# Patient Record
Sex: Male | Born: 1940 | Race: Black or African American | Hispanic: No | Marital: Married | Smoking: Never smoker
Health system: Southern US, Community
[De-identification: ages and names within clinical notes are randomized; demographics above are authoritative.]

## PROBLEM LIST (undated history)

## (undated) DIAGNOSIS — C189 Malignant neoplasm of colon, unspecified: Secondary | ICD-10-CM

## (undated) DIAGNOSIS — E119 Type 2 diabetes mellitus without complications: Secondary | ICD-10-CM

## (undated) DIAGNOSIS — I10 Essential (primary) hypertension: Secondary | ICD-10-CM

## (undated) DIAGNOSIS — E785 Hyperlipidemia, unspecified: Secondary | ICD-10-CM

## (undated) HISTORY — DX: Type 2 diabetes mellitus without complications: E11.9

## (undated) HISTORY — PX: COLON SURGERY: SHX602

## (undated) HISTORY — DX: Essential (primary) hypertension: I10

---

## 2005-01-26 DIAGNOSIS — G459 Transient cerebral ischemic attack, unspecified: Secondary | ICD-10-CM

## 2005-01-26 HISTORY — DX: Transient cerebral ischemic attack, unspecified: G45.9

## 2005-06-29 ENCOUNTER — Inpatient Hospital Stay (HOSPITAL_COMMUNITY): Admission: EM | Admit: 2005-06-29 | Discharge: 2005-07-01 | Payer: Self-pay | Admitting: Emergency Medicine

## 2005-06-29 ENCOUNTER — Ambulatory Visit (HOSPITAL_COMMUNITY): Admission: RE | Admit: 2005-06-29 | Discharge: 2005-06-29 | Payer: Self-pay | Admitting: Emergency Medicine

## 2005-06-30 ENCOUNTER — Ambulatory Visit: Payer: Self-pay | Admitting: Physical Medicine & Rehabilitation

## 2005-07-07 ENCOUNTER — Encounter: Admission: RE | Admit: 2005-07-07 | Discharge: 2005-10-05 | Payer: Self-pay | Admitting: *Deleted

## 2005-07-13 ENCOUNTER — Encounter: Admission: RE | Admit: 2005-07-13 | Discharge: 2005-10-11 | Payer: Self-pay | Admitting: *Deleted

## 2005-09-03 ENCOUNTER — Encounter: Payer: Self-pay | Admitting: Neurology

## 2005-09-16 ENCOUNTER — Ambulatory Visit (HOSPITAL_COMMUNITY): Admission: RE | Admit: 2005-09-16 | Discharge: 2005-09-16 | Payer: Self-pay | Admitting: Interventional Radiology

## 2007-03-20 IMAGING — XA IR ANGIO/CAROTID/CERV BI
1 series · 16 of 24 positions shown · IV contrast (omnipaque)
Comparison: none

CLINICAL DATA: Patient with left posterior fossa ischemic stroke secondary to left vertebrobasilar junction dissection with possible right vertebrobasilar junction stenosis.  
FOUR VESSEL CEREBRAL ARTERIOGRAM:
Following a full explanation of the procedure along with the potentially associated complications, an informed witnessed consent was obtained. 
The right groin was prepped and draped in the usual sterile fashion.  Thereafter using a modified Seldinger technique transfemoral access into the right common femoral artery was obtained without difficulty.  Over a 0.035 inch guide wire, a 5 French Pinnacle sheath was inserted.  Through this and also over a 0.035 inch guide wire, a 5 French JB-1 catheter was advanced through the aortic arch region and selectively positioned in the right common carotid artery, right vertebral artery, left common carotid artery and left subclavian artery.  
There were no acute complications.  The patient tolerated the procedure well. 
Medications utilized:  Versed 0 mg IV, fentanyl 0 micrograms IV.  Apresoline 10 mg, Labetalol 10 mg IV.  
Contrast:  Omnipaque 300, approximately 65 cc.

[Series 1: run · 16 of 298 slices shown]
[im 1/298]
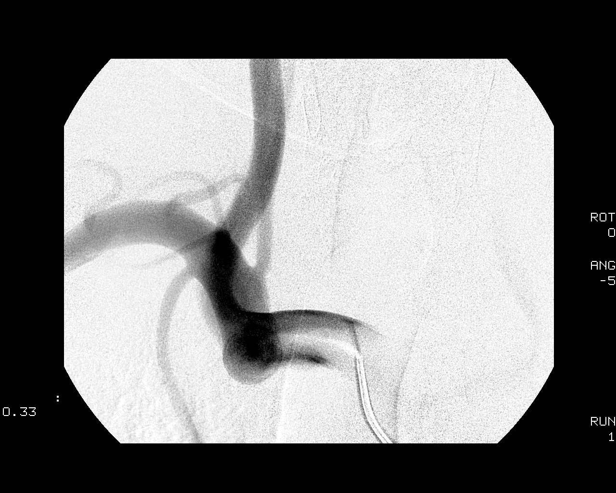
[im 26/298]
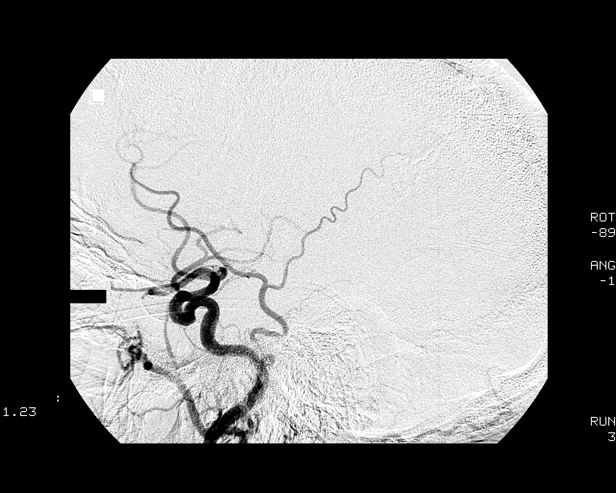
[im 39/298]
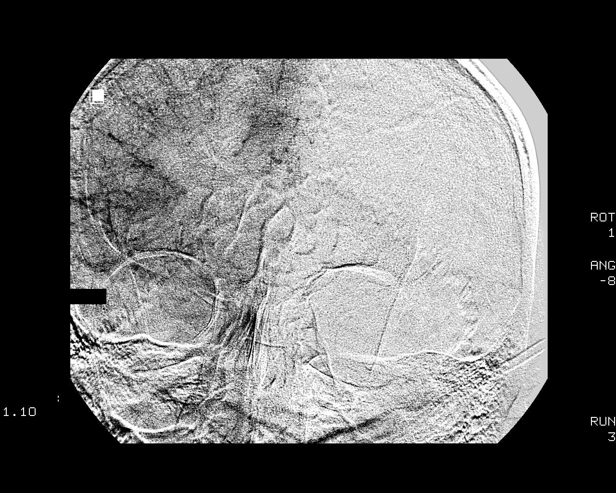
[im 65/298]
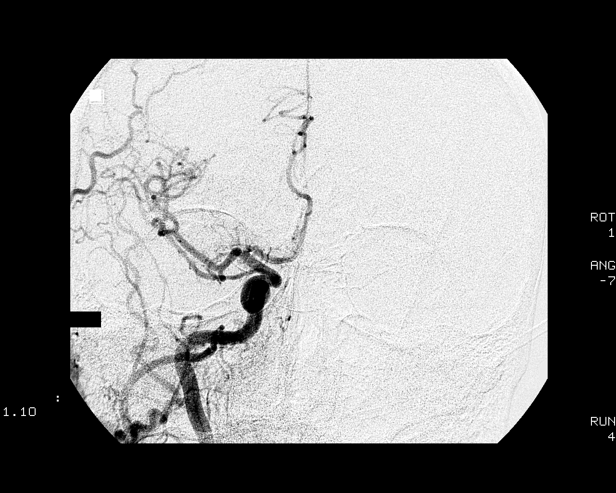
[im 78/298]
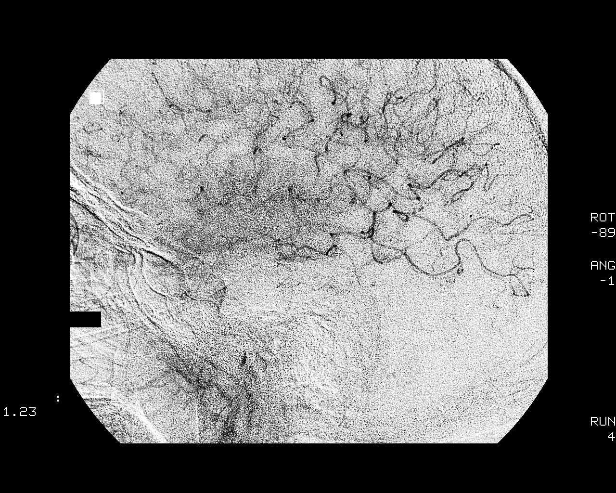
[im 104/298]
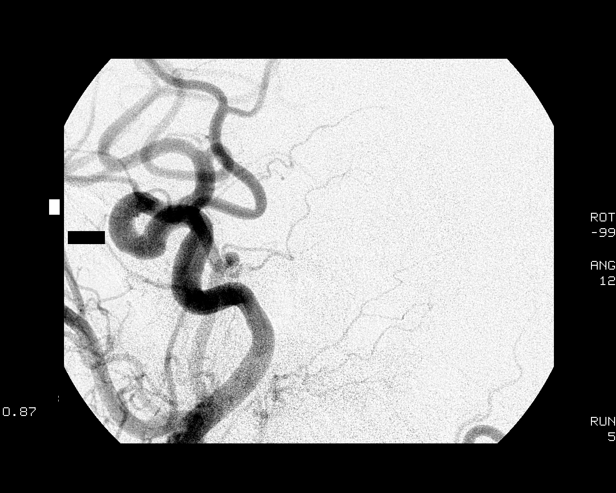
[im 117/298]
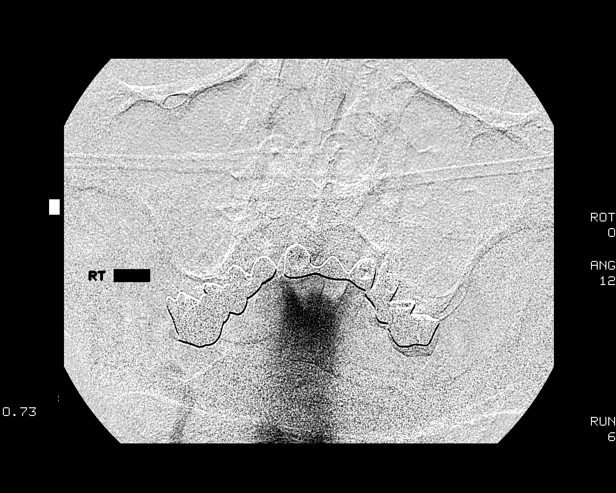
[im 143/298]
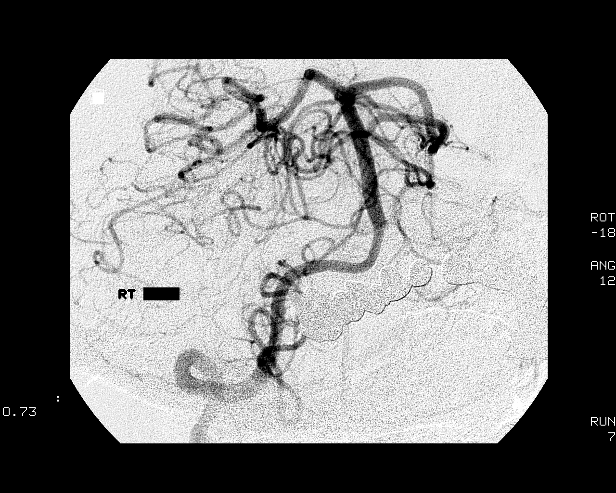
[im 155/298]
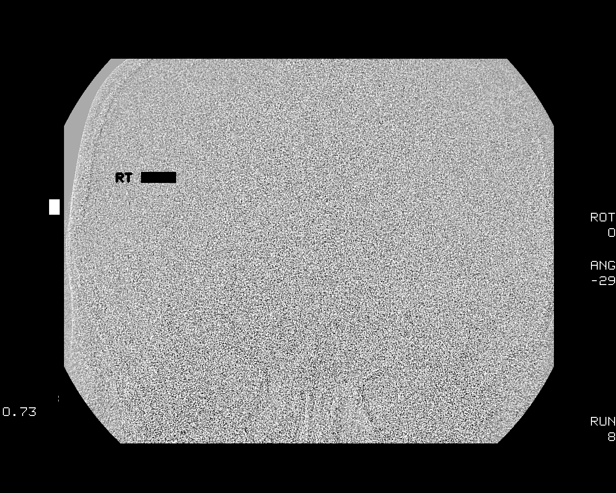
[im 181/298]
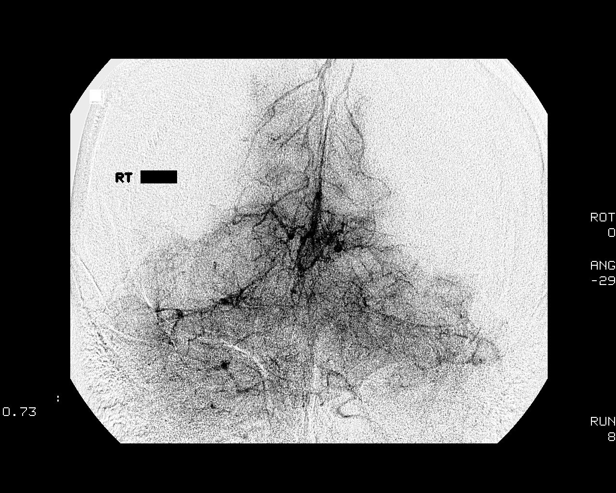
[im 194/298]
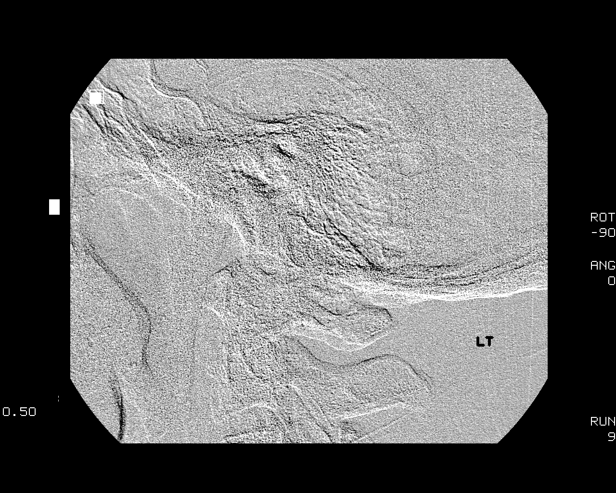
[im 220/298]
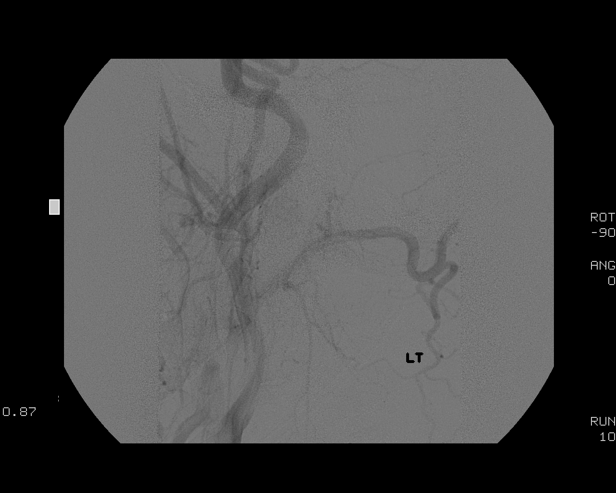
[im 233/298]
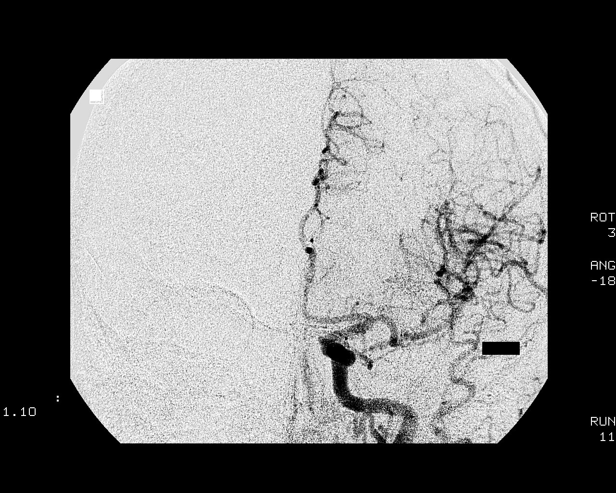
[im 259/298]
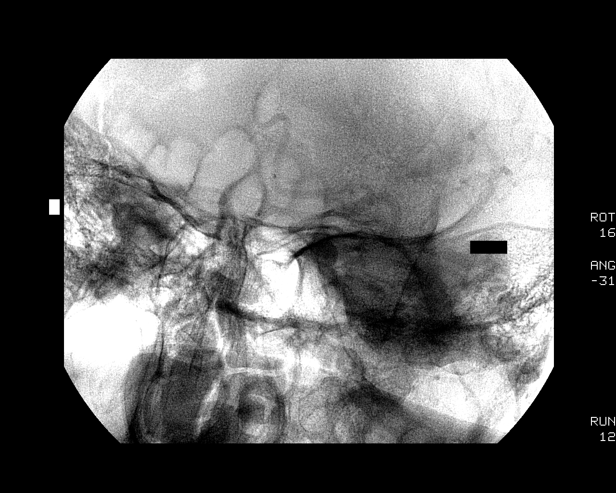
[im 272/298]
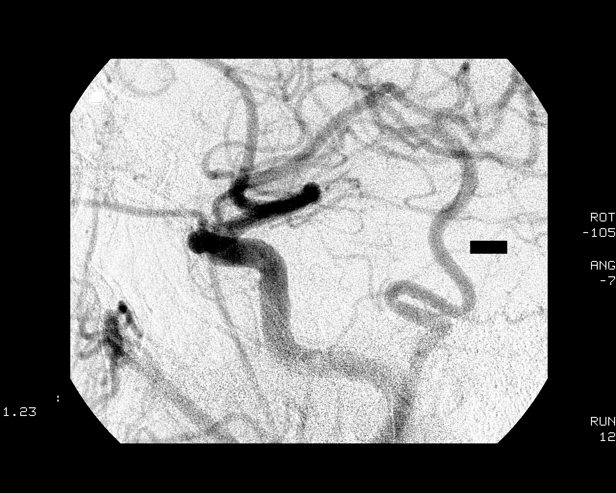
[im 298/298]
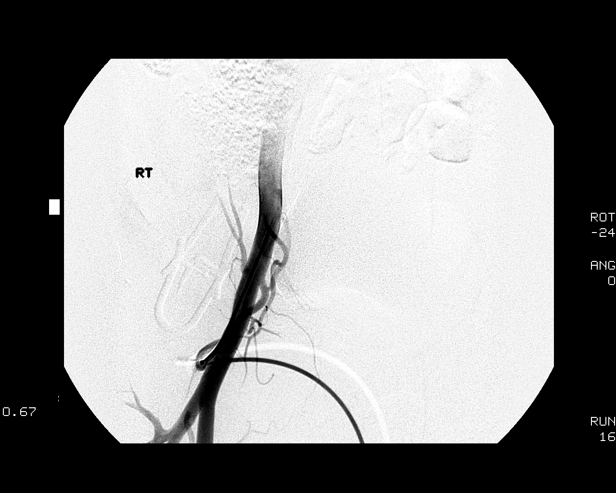

[16 of 24 positions shown; findings below may reference images not displayed]

FINDINGS: The right common carotid arteriogram demonstrates the right external carotid artery to be normal at its origin.  It branches are normally opacified.  
The right internal carotid artery at the bulb and distally to the cranial skull base is also normal.  
The petrous segment is normal.  In the proximal cavernous portion there is a focal broad based saccular prominence projecting inferiorly.  Distal to this the vessel resumes normal caliber and opacification to the supraclinoid segments.  
The right middle and the right anterior cerebral arteries opacify normally into capillary and venous phases.  
The right vertebral artery origin is normal.  The vessel opacifies normally to the cranial skull base.  There is normal opacification of the right posterior inferior cerebellar artery at the right vertebrobasilar junction.  The basilar artery, posterior cerebral artery, superior cerebellar arteries and the anterior-inferior cerebellar arteries opacify normally into capillary and venous phases.  
Transit opacification of the distal left vertebral artery from the right vertebral artery is noted retrogradely.  The subsequent capillary and the venous phases are normal.  
The left subclavian arteriogram demonstrates a stump of the previously occluded left vertebral artery at its origin.  More distally at the level of approximately C2 there is reconstitution of ascending cervical branch of the paracervical trunk.  Opacification of the vertebral artery is noted to the cranial skull base without discrete opacification noted of the left PICA.  
The left common carotid arteriogram demonstrates left external carotid artery and its major branches to be normal.  Left internal carotid artery at the bulb and distally to the cranial skull base is normally opacified.  
The petrous, cavernous and the supraclinoid segments are normal.  The left middle artery has a mild stenosis at its origin.  
The left anterior and the left middle cerebral arteries otherwise opacify normally into capillary and venous phases.  
A 3D rotational arteriogram performed demonstrates no evidence of an aneurysm in the left periophthalmic artery region as detected on the biplane DSA images.
IMPRESSION: 1.  Angiographically occluded left vertebral artery at its origin with partial reconstitution of the distal left vertebral artery from the ascending cervical branch of the thyrocervical trunk.  
2.  Continued angiographic occlusion of the left vertebrobasilar junction. 
3.  Right vertebrobasilar junction widely patent.

## 2010-07-26 DIAGNOSIS — E785 Hyperlipidemia, unspecified: Secondary | ICD-10-CM | POA: Insufficient documentation

## 2010-07-26 DIAGNOSIS — E299 Testicular dysfunction, unspecified: Secondary | ICD-10-CM | POA: Insufficient documentation

## 2010-07-26 DIAGNOSIS — I6789 Other cerebrovascular disease: Secondary | ICD-10-CM | POA: Insufficient documentation

## 2011-08-18 DIAGNOSIS — N529 Male erectile dysfunction, unspecified: Secondary | ICD-10-CM | POA: Insufficient documentation

## 2012-02-29 DIAGNOSIS — K635 Polyp of colon: Secondary | ICD-10-CM | POA: Insufficient documentation

## 2012-02-29 DIAGNOSIS — D126 Benign neoplasm of colon, unspecified: Secondary | ICD-10-CM | POA: Insufficient documentation

## 2012-04-28 DIAGNOSIS — K9189 Other postprocedural complications and disorders of digestive system: Secondary | ICD-10-CM | POA: Insufficient documentation

## 2012-07-13 DIAGNOSIS — H35371 Puckering of macula, right eye: Secondary | ICD-10-CM | POA: Insufficient documentation

## 2013-08-30 ENCOUNTER — Encounter: Payer: Self-pay | Admitting: Endocrinology

## 2013-08-30 ENCOUNTER — Other Ambulatory Visit: Payer: Self-pay | Admitting: *Deleted

## 2013-08-30 ENCOUNTER — Other Ambulatory Visit: Payer: Self-pay | Admitting: Endocrinology

## 2013-08-30 ENCOUNTER — Ambulatory Visit (INDEPENDENT_AMBULATORY_CARE_PROVIDER_SITE_OTHER): Payer: Medicare Other | Admitting: Endocrinology

## 2013-08-30 VITALS — BP 140/70 | HR 60 | Temp 98.3°F | Resp 16 | Ht 69.5 in | Wt 200.8 lb

## 2013-08-30 DIAGNOSIS — E1165 Type 2 diabetes mellitus with hyperglycemia: Principal | ICD-10-CM

## 2013-08-30 DIAGNOSIS — E7800 Pure hypercholesterolemia, unspecified: Secondary | ICD-10-CM

## 2013-08-30 DIAGNOSIS — IMO0001 Reserved for inherently not codable concepts without codable children: Secondary | ICD-10-CM

## 2013-08-30 DIAGNOSIS — I10 Essential (primary) hypertension: Secondary | ICD-10-CM

## 2013-08-30 DIAGNOSIS — E119 Type 2 diabetes mellitus without complications: Secondary | ICD-10-CM | POA: Insufficient documentation

## 2013-08-30 LAB — GLUCOSE, POCT (MANUAL RESULT ENTRY): POC Glucose: 287 mg/dl — AB (ref 70–99)

## 2013-08-30 MED ORDER — CANAGLIFLOZIN-METFORMIN HCL 50-1000 MG PO TABS: ORAL_TABLET | ORAL | Status: DC

## 2013-08-30 NOTE — Progress Notes (Signed)
Patient ID: Robert Cortez, male   DOB: 10/29/1940, 73 y.o.   MRN: [id number]     Reason for Appointment: Consultation for Type 2 Diabetes  Referring physician: Nnodi  History of Present Illness:          Diagnosis: Type 2 diabetes mellitus, date of diagnosis: 1990s Past history: He was treated with an unknown oral hypoglycemic drugs at diagnosis for a few years 10-15 years ago he was started on insulin and was continued on this for about 5 years. Probably took 70/30 insulin twice a day, unknown doses. Subsequently his physician told him he did not need any insulin and he was started on oral hypoglycemic drugs again 3-4 years ago he was given Victoza in addition to his Janumet and Amaryl. He thinks his blood sugars improved slightly with Victoza but have been persistently high the last few years  Recent history:  He is established with his new primary care physician in 2/15 and his A1c at that time was 12.8% No change in medications were made at that time and he had not agreed to endocrinology consultation at that time More recently his A1c was 11.7 in 7/15     He does not watch his diet well and is usually eating at fast food places at lunch time including today    He does not think his blood sugar is over 180, glucose in the lab was previously 237 Is compliant with his Victoza 1.8 mg and has no side effects Recently he has not been able to lose more than about 5 pounds despite exercising  Oral hypoglycemic drugs the patient is taking are:   Amaryl, Janumet     Side effects from medications have been: None     Compliance with the medical regimen: Fair Hypoglycemia:   none  Glucose monitoring:  1-2 times a day        Glucometer: ? One Touch.      Blood Glucose readings range 120-180, check before breakfast or supper  Glycemic control:   A1c was 11.7 in 7/15   Microalbumin/Creatinine ratio 18.7  No results found for this basename: HGBA1C   No results found for this basename:  GLUF,  MICROALBUR,  LDLCALC,  CREATININE   Last creatinine 1.07 Retinal exam: Most recent:9/14.   Self-care: The diet that the patient has been following is: tries to limit portions      Meals: 3 meals per day. Breakfast is fruit, usually having this before his exercise         Exercise:  at the [facility] 4/7, also goes out biking         Dietician visit: Most recent:unknown.               Weight history:195-230  Wt Readings from Last 3 Encounters:  08/30/13 200 lb 12.8 oz (91.082 kg)       Medication List       This list is accurate as of: 08/30/13  9:58 PM.  Always use your most recent med list.               ACCU-CHEK COMFORT CURVE test strip  Generic drug:  glucose blood     amLODipine 10 MG tablet  Commonly known as:  NORVASC  Take by mouth.     amLODipine 10 MG tablet  Commonly known as:  NORVASC     aspirin 325 MG tablet  Take by mouth.     Canagliflozin-Metformin HCl 50-1000 MG Tabs  Commonly  known as:  INVOKAMET  TAKE 1 TABLET TWICE DAILY     glimepiride 4 MG tablet  Commonly known as:  AMARYL     hydrochlorothiazide 25 MG tablet  Commonly known as:  HYDRODIURIL     JANUMET 50-1000 MG per tablet  Generic drug:  sitaGLIPtin-metformin  Take by mouth.     losartan 100 MG tablet  Commonly known as:  COZAAR     metoprolol succinate 50 MG 24 hr tablet  Commonly known as:  TOPROL-XL     NOVOFINE 32G X 6 MM Misc  Generic drug:  Insulin Pen Needle     simvastatin 20 MG tablet  Commonly known as:  ZOCOR     TEKTURNA 300 MG tablet  Generic drug:  aliskiren  Take by mouth.     VICTOZA 18 MG/3ML Sopn  Generic drug:  Liraglutide  1.8 mg daily.        Allergies:  Allergies  Allergen Reactions  . Bee Venom Anaphylaxis  . Shellfish Allergy Anaphylaxis and Hives    Past Medical History  Diagnosis Date  . Hypertension     Past Surgical History  Procedure Laterality Date  . Colon surgery      Precancerous lesion    Family History  Problem  Relation Age of Onset  . Diabetes Mother   . Hypertension Mother     Social History:  reports that he has never smoked. He has never used smokeless tobacco. His alcohol and drug histories are not on file.    Review of Systems       Lipids: This has been mild and he has recently started on Zocor by PCP  Last LDL was 129 in 7/15 with HDL 72       No results found for this basename: CHOL,  HDL,  LDLCALC,  LDLDIRECT,  TRIG,  CHOLHDL                  Skin: No rash or infections     Thyroid:  No  unusual fatigue history of thyroid disease.     The blood pressure has been 30 years on multiple drugs, not checking recently at home     No swelling of feet.     No shortness of breath or chest tightness  on exertion.     Bowel habits: Normal.       No frequency of urination , has nocturia 1 time      No joint  pains.          No history of Numbness, tingling or burning in feet     LABS:  Office Visit on 08/30/2013  Component Date Value Ref Range Status  . POC Glucose 08/30/2013 287* 70 - 99 mg/dl Final    Physical Examination:  BP 140/70  Pulse 60  Temp(Src) 98.3 F (36.8 C)  Resp 16  Ht 5' 9.5" (1.765 m)  Wt 200 lb 12.8 oz (91.082 kg)  BMI 29.24 kg/m2  SpO2 98%  GENERAL:         Patient has mild generalized obesity.   HEENT:         Eye exam shows normal external appearance. Fundus exam shows no retinopathy. Oral exam shows normal mucosa .  NECK:         General:  Neck exam shows no lymphadenopathy. Carotids are normal to palpation and no bruit heard.  Thyroid is not enlarged and no nodules felt.   LUNGS:  Chest is symmetrical. Lungs are clear to auscultation.Marland Kitchen   HEART:         Heart sounds:  S1 and S2 are normal. No murmurs or clicks heard., no S3 or S4.   ABDOMEN:   There is no distention present. Liver and spleen are not palpable. No other mass or tenderness present.  EXTREMITIES:     There is no edema. No skin lesions present.Marland Kitchen  NEUROLOGICAL:    Vibration sense is  moderately reduced in toes. Ankle jerks are absent bilaterally.          Diabetic foot exam:  Diabetic foot exam shows normal monofilament sensation in the toes and plantar surfaces, no skin lesions or ulcers on the feet and normal pedal pulses MUSCULOSKELETAL:       There is no enlargement or deformity of the joints. Spine is normal to inspection.Marland Kitchen   SKIN:       No rash or lesions of concern.        ASSESSMENT:  Diabetes type 2, uncontrolled with A1c of 11.7% Currently on a regimen of maximum dose of Janumet, Victoza and Amaryl Explain to him that he does not need Januvia if he is taking Victoza Since he has had long-standing diabetes he is likely to be insulin deficient Most of his high readings are after meals which he is not documenting; glucose today is 287 after lunch Also not clear if his home glucose monitor is accurate he is not getting any readings over 180    Although he is compliant with exercise he is not watching his diet and generally had high fat meals especially at lunch  Complications: Minimal  Hypertension: Controlled on multiple drugs  History of hyperlipidemia: This has been mild and he has recently started on Zocor by PCP  PLAN:   Before starting insulin he will be given a trial of Invokana Discussed action of SGLT 2 drugs on lowering glucose by decreasing kidney absorption of glucose, benefits of weight loss and lower blood pressure, possible side effects including candidiasis and dosage regimen   For simplicity will switch his Janumet to Invokamet  To start with 100 mg Invokana daily and adjust dose if needed  He can also make significant changes in his diet and given him a list of low fat foods  He will need consultation with dietitian and nurse educator  He will start checking at least half of his readings after meals  He will bring his monitor for download on his next visit  To avoid low blood pressure we will stop HCTZ when  starting Invokana. He will check his blood pressure at home  We'll need to confirm that his renal function and potassium and normal today and also do followup levels on his next visit  Patient Instructions  Please check blood sugars at least half the time about 2 hours after any meal and 3-4 times a week on waking up. Please bring blood sugar monitor to each visit  If not exercising in the morning you should have a little protein with breakfast as discussed  Stop Janumet and start Invokamet 50/1000 twice a day at breakfast and dinner Stop hydrochlorothiazide  Low fat meals as discussed  Check blood pressure at least once a week and if it is going over 150 restart hydrochlorothiazide at half tablet daily   Counseling time over 50% of today's 60 minute visit   Jameon Deller 08/30/2013, 9:58 PM   Note: This office note was prepared with Dragon voice recognition  system technology. Any transcriptional errors that result from this process are unintentional.

## 2013-08-30 NOTE — Patient Instructions (Signed)
Please check blood sugars at least half the time about 2 hours after any meal and 3-4 times a week on waking up. Please bring blood sugar monitor to each visit  If not exercising in the morning you should have a little protein with breakfast as discussed  Stop Janumet and start Invokamet 50/1000 twice a day at breakfast and dinner Stop hydrochlorothiazide  Low fat meals as discussed  Check blood pressure at least once a week and if it is going over 150 restart hydrochlorothiazide at half tablet daily

## 2013-08-31 LAB — BASIC METABOLIC PANEL
BUN: 18 mg/dL (ref 6–23)
CO2: 27 mEq/L (ref 19–32)
Calcium: 9.4 mg/dL (ref 8.4–10.5)
Chloride: 98 mEq/L (ref 96–112)
Creat: 1.17 mg/dL (ref 0.50–1.35)
Glucose, Bld: 250 mg/dL — ABNORMAL HIGH (ref 70–99)
Potassium: 3.6 mEq/L (ref 3.5–5.3)
SODIUM: 137 meq/L (ref 135–145)

## 2013-09-20 ENCOUNTER — Other Ambulatory Visit (INDEPENDENT_AMBULATORY_CARE_PROVIDER_SITE_OTHER): Payer: Medicare Other

## 2013-09-20 DIAGNOSIS — IMO0001 Reserved for inherently not codable concepts without codable children: Secondary | ICD-10-CM

## 2013-09-20 DIAGNOSIS — E1165 Type 2 diabetes mellitus with hyperglycemia: Principal | ICD-10-CM

## 2013-09-20 LAB — COMPREHENSIVE METABOLIC PANEL
ALBUMIN: 3.7 g/dL (ref 3.5–5.2)
ALK PHOS: 66 U/L (ref 39–117)
ALT: 13 U/L (ref 0–53)
AST: 20 U/L (ref 0–37)
BILIRUBIN TOTAL: 0.8 mg/dL (ref 0.2–1.2)
BUN: 13 mg/dL (ref 6–23)
CO2: 28 mEq/L (ref 19–32)
Calcium: 9 mg/dL (ref 8.4–10.5)
Chloride: 103 mEq/L (ref 96–112)
Creatinine, Ser: 1.1 mg/dL (ref 0.4–1.5)
GFR: 89.58 mL/min (ref >60.00)
Glucose, Bld: 136 mg/dL — ABNORMAL HIGH (ref 70–99)
Potassium: 3.6 mEq/L (ref 3.5–5.1)
SODIUM: 137 meq/L (ref 135–145)
TOTAL PROTEIN: 7.3 g/dL (ref 6.0–8.3)

## 2013-09-22 LAB — FRUCTOSAMINE: Fructosamine: 388 umol/L — ABNORMAL HIGH (ref 190–270)

## 2013-09-25 ENCOUNTER — Encounter: Payer: Self-pay | Admitting: Endocrinology

## 2013-09-25 ENCOUNTER — Other Ambulatory Visit: Payer: Self-pay | Admitting: *Deleted

## 2013-09-25 ENCOUNTER — Ambulatory Visit (INDEPENDENT_AMBULATORY_CARE_PROVIDER_SITE_OTHER): Payer: Medicare Other | Admitting: Endocrinology

## 2013-09-25 VITALS — BP 130/62 | HR 60 | Temp 98.3°F | Resp 14 | Ht 69.5 in | Wt 200.0 lb

## 2013-09-25 DIAGNOSIS — IMO0001 Reserved for inherently not codable concepts without codable children: Secondary | ICD-10-CM

## 2013-09-25 DIAGNOSIS — I10 Essential (primary) hypertension: Secondary | ICD-10-CM | POA: Insufficient documentation

## 2013-09-25 DIAGNOSIS — E1165 Type 2 diabetes mellitus with hyperglycemia: Principal | ICD-10-CM

## 2013-09-25 DIAGNOSIS — E785 Hyperlipidemia, unspecified: Secondary | ICD-10-CM

## 2013-09-25 NOTE — Progress Notes (Signed)
Patient ID: Robert Cortez, male   DOB: 05/07/1940, 73 y.o.   MRN: [id number]     Reason for Appointment: F/u for Type 2 Diabetes  Referring physician: Nnodi  History of Present Illness:          Diagnosis: Type 2 diabetes mellitus, date of diagnosis: 1990s Past history: He was treated with an unknown oral hypoglycemic drugs at diagnosis for a few years 10-15 years ago he was started on insulin and was continued on this for about 5 years. Probably took 70/30 insulin twice a day, unknown doses. Subsequently his physician told him he did not need any insulin and he was started on oral hypoglycemic drugs again 3-4 years ago he was given Victoza in addition to his Janumet and Amaryl. He thinks his blood sugars improved slightly with Victoza but have been persistently high the last few years  Recent history:  He has had poor control of his diabetes since at least early 2015 More recently his A1c was 11.7 in 7/15     This is despite taking a multidrug regimen including Janumet, Amaryl and Victoza 1.8 mg Because of poor control he was started on Invokana on 08/30/13 Review of his blood sugars indicate he had significantly better readings for about 4-5 days but since 8/18 and blood sugars have been mostly over 200 He thinks that his blood sugars started going up and he was eating out and traveling until last Saturday Has not checked his sugar since last Thursday He did not have any side effects from Invokana, using the combination with metformin and not taking Janumet Also he has not been able to exercise as regularly because of traveling  Oral hypoglycemic drugs the patient is taking are:   Amaryl, Invokamet 50/1000 twice a day     Side effects from medications have been: None     Compliance with the medical regimen: Inconsistent Hypoglycemia:   none  Glucose monitoring:  1-2 times a day        Glucometer:  One Touch.      Blood Glucose readings  PREMEAL Breakfast Lunch  7-8 PM  Bedtime  Overall  Glucose range:  129-269   196-264   101-275   164-224    Median:  175      200      Glycemic control:   A1c was 11.7 in 7/15   Microalbumin/Creatinine ratio 18.7  No results found for this basename: HGBA1C   No results found for this basename: GLUF,  MICROALBUR,  LDLCALC,  CREATININE   Last creatinine 1.07 Retinal exam: Most recent:9/14.   Self-care: The diet that the patient has been following is: tries to limit portions, recently using mostly chicken and fish      Meals: 3 meals per day. Breakfast is light, recently getting some protein        Exercise:  at the [facility] 4/7, also goes out biking         Dietician visit: Most recent:2012.               Weight history:195-230  Wt Readings from Last 3 Encounters:  09/25/13 200 lb (90.719 kg)  08/30/13 200 lb 12.8 oz (91.082 kg)       Medication List       This list is accurate as of: 09/25/13  1:55 PM.  Always use your most recent med list.               ACCU-CHEK COMFORT CURVE test  strip  Generic drug:  glucose blood     amLODipine 10 MG tablet  Commonly known as:  NORVASC  Take by mouth.     aspirin 325 MG tablet  Take by mouth.     Canagliflozin-Metformin HCl 50-1000 MG Tabs  Commonly known as:  INVOKAMET  TAKE 1 TABLET TWICE DAILY     glimepiride 4 MG tablet  Commonly known as:  AMARYL     hydrochlorothiazide 25 MG tablet  Commonly known as:  HYDRODIURIL     JANUMET 50-1000 MG per tablet  Generic drug:  sitaGLIPtin-metformin  Take by mouth.     losartan 100 MG tablet  Commonly known as:  COZAAR     metoprolol succinate 50 MG 24 hr tablet  Commonly known as:  TOPROL-XL     NOVOFINE 32G X 6 MM Misc  Generic drug:  Insulin Pen Needle     simvastatin 20 MG tablet  Commonly known as:  ZOCOR     TEKTURNA 300 MG tablet  Generic drug:  aliskiren  Take by mouth.     VICTOZA 18 MG/3ML Sopn  Generic drug:  Liraglutide  1.8 mg daily.        Allergies:  Allergies  Allergen Reactions   . Bee Venom Anaphylaxis  . Shellfish Allergy Anaphylaxis and Hives    Past Medical History  Diagnosis Date  . Hypertension     Past Surgical History  Procedure Laterality Date  . Colon surgery      Precancerous lesion    Family History  Problem Relation Age of Onset  . Diabetes Mother   . Hypertension Mother     Social History:  reports that he has never smoked. He has never used smokeless tobacco. His alcohol and drug histories are not on file.    Review of Systems       Lipids: This has been mild and he has recently started on Zocor by PCP  Last LDL was 129 in 7/15 with HDL 72       No results found for this basename: CHOL,  HDL,  LDLCALC,  LDLDIRECT,  TRIG,  CHOLHDL                   The blood pressure has been 30 years on multiple drugs, not checking recently at home     LABS:  Appointment on 09/20/2013  Component Date Value Ref Range Status  . Fructosamine 09/20/2013 388* 190 - 270 umol/L Final  . Sodium 09/20/2013 137  135 - 145 mEq/L Final  . Potassium 09/20/2013 3.6  3.5 - 5.1 mEq/L Final  . Chloride 09/20/2013 103  96 - 112 mEq/L Final  . CO2 09/20/2013 28  19 - 32 mEq/L Final  . Glucose, Bld 09/20/2013 136* 70 - 99 mg/dL Final  . BUN 09/20/2013 13  6 - 23 mg/dL Final  . Creatinine, Ser 09/20/2013 1.1  0.4 - 1.5 mg/dL Final  . Total Bilirubin 09/20/2013 0.8  0.2 - 1.2 mg/dL Final  . Alkaline Phosphatase 09/20/2013 66  39 - 117 U/L Final  . AST 09/20/2013 20  0 - 37 U/L Final  . ALT 09/20/2013 13  0 - 53 U/L Final  . Total Protein 09/20/2013 7.3  6.0 - 8.3 g/dL Final  . Albumin 09/20/2013 3.7  3.5 - 5.2 g/dL Final  . Calcium 09/20/2013 9.0  8.4 - 10.5 mg/dL Final  . GFR 09/20/2013 89.58  >60.00 mL/min Final    Physical Examination:  BP  130/62  Pulse 60  Temp(Src) 98.3 F (36.8 C)  Resp 14  Ht 5' 9.5" (1.765 m)  Wt 200 lb (90.719 kg)  BMI 29.12 kg/m2  SpO2 98%     not indicated    ASSESSMENT/PLAN:    Diabetes type 2, uncontrolled   Although he appeared to have fairly good blood sugars for with starting Invokana he again has poor control and mostly sugars over 200 and fructosamine significantly high He claims that all his high sugars are related to traveling with eating out and not watching his diet or exercising regularly He is reluctant to change his treatment and wants to try getting regular with his diet and exercise regimen again Will have him come back in 6 weeks for reassessment However he will call if blood sugars are not improved Consider adding insulin if blood sugars are not reasonably good  Katelynne Revak 09/25/2013, 1:55 PM   Note: This office note was prepared with Dragon voice recognition system technology. Any transcriptional errors that result from this process are unintentional.

## 2013-09-25 NOTE — Patient Instructions (Addendum)
Please check blood sugars at least half the time about 2 hours after any meal and 3-4 times per week on waking up. Please bring blood sugar monitor to each visit   

## 2013-11-01 ENCOUNTER — Other Ambulatory Visit: Payer: Medicare Other

## 2013-11-06 ENCOUNTER — Ambulatory Visit: Payer: Medicare Other | Admitting: Endocrinology

## 2013-11-15 ENCOUNTER — Other Ambulatory Visit: Payer: Medicare Other

## 2013-11-20 ENCOUNTER — Ambulatory Visit: Payer: Medicare Other | Admitting: Endocrinology

## 2013-12-08 ENCOUNTER — Other Ambulatory Visit: Payer: Medicare Other

## 2013-12-13 ENCOUNTER — Ambulatory Visit: Payer: Medicare Other | Admitting: Endocrinology

## 2013-12-29 ENCOUNTER — Other Ambulatory Visit: Payer: Self-pay | Admitting: Endocrinology

## 2014-01-29 ENCOUNTER — Other Ambulatory Visit: Payer: Self-pay | Admitting: Endocrinology

## 2014-02-01 ENCOUNTER — Other Ambulatory Visit: Payer: Self-pay | Admitting: Endocrinology

## 2014-06-18 ENCOUNTER — Ambulatory Visit (INDEPENDENT_AMBULATORY_CARE_PROVIDER_SITE_OTHER): Payer: Medicare Other | Admitting: Podiatry

## 2014-06-18 ENCOUNTER — Ambulatory Visit: Payer: Medicare Other

## 2014-06-18 ENCOUNTER — Encounter: Payer: Self-pay | Admitting: Podiatry

## 2014-06-18 DIAGNOSIS — G609 Hereditary and idiopathic neuropathy, unspecified: Secondary | ICD-10-CM

## 2014-06-18 DIAGNOSIS — R52 Pain, unspecified: Secondary | ICD-10-CM

## 2014-06-18 NOTE — Progress Notes (Signed)
   Subjective:    Patient ID: Robert Cortez, male    DOB: 15-May-1940, 74 y.o.   MRN: [id number]  HPI  N-TINGLING SENSATION L-LT BALL OF THE FOOT D-6 MONTHS O-SLOWLY C-SAME BUT WORSE AT NIGHT A-SITTING T-MIRACLE FEET (OTC)  Review of Systems  Allergic/Immunologic: Positive for food allergies.   Patient denies any history of claudication or foot ulcerations    Objective:   Physical Exam  Orientated 3  Vascular: DP pulses 1/4 bilaterally PT pulses 2/4 bilaterally No peripheral edema noted bilaterally Capillary reflex immediate bilaterally  Neurological: Sensation to 10 g monofilament wire intact 5/5 bilaterally Ankle reflex equal and reactive bilaterally Vibratory sensation intact bilaterally  Dermatological: Dry skin noted without any open lesions bilaterally The toenails are discolored, incurvated, hypertrophic and trimmed neatly 6-10  Musculoskeletal: HAV deformity bilaterally Hammertoe second left No paresthesias elicited with compression of the second or third left intermetatarsal space No palpable lesions in the plantar subsecond MPJ area left which is the area patient describes numbness, without any pain    Assessment & Plan:   Assessment: Protective sensation intact bilaterally 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 I attached a metatarsal pad and patient's shoe insole dystrophic and offload the second-fourth left MPJ area  Reappoint at patient's request or at yearly intervals

## 2014-06-18 NOTE — Patient Instructions (Signed)
Use a soft metatarsal pad as demonstrated in the treatment room to take pressure off the bottom of the left foot  Diabetes and Foot Care Diabetes may cause you to have problems because of poor blood supply (circulation) to your feet and legs. This may cause the skin on your feet to become thinner, break easier, and heal more slowly. Your skin may become dry, and the skin may peel and crack. You may also have nerve damage in your legs and feet causing decreased feeling in them. You may not notice minor injuries to your feet that could lead to infections or more serious problems. Taking care of your feet is one of the most important things you can do for yourself.  HOME CARE INSTRUCTIONS  Wear shoes at all times, even in the house. Do not go barefoot. Bare feet are easily injured.  Check your feet daily for blisters, cuts, and redness. If you cannot see the bottom of your feet, use a mirror or ask someone for help.  Wash your feet with warm water (do not use hot water) and mild soap. Then pat your feet and the areas between your toes until they are completely dry. Do not soak your feet as this can dry your skin.  Apply a moisturizing lotion or petroleum jelly (that does not contain alcohol and is unscented) to the skin on your feet and to dry, brittle toenails. Do not apply lotion between your toes.  Trim your toenails straight across. Do not dig under them or around the cuticle. File the edges of your nails with an emery board or nail file.  Do not cut corns or calluses or try to remove them with medicine.  Wear clean socks or stockings every day. Make sure they are not too tight. Do not wear knee-high stockings since they may decrease blood flow to your legs.  Wear shoes that fit properly and have enough cushioning. To break in new shoes, wear them for just a few hours a day. This prevents you from injuring your feet. Always look in your shoes before you put them on to be sure there are no  objects inside.  Do not cross your legs. This may decrease the blood flow to your feet.  If you find a minor scrape, cut, or break in the skin on your feet, keep it and the skin around it clean and dry. These areas may be cleansed with mild soap and water. Do not cleanse the area with peroxide, alcohol, or iodine.  When you remove an adhesive bandage, be sure not to damage the skin around it.  If you have a wound, look at it several times a day to make sure it is healing.  Do not use heating pads or hot water bottles. They may burn your skin. If you have lost feeling in your feet or legs, you may not know it is happening until it is too late.  Make sure your health care provider performs a complete foot exam at least annually or more often if you have foot problems. Report any cuts, sores, or bruises to your health care provider immediately. SEEK MEDICAL CARE IF:   You have an injury that is not healing.  You have cuts or breaks in the skin.  You have an ingrown nail.  You notice redness on your legs or feet.  You feel burning or tingling in your legs or feet.  You have pain or cramps in your legs and feet.  Your  legs or feet are numb.  Your feet always feel cold. SEEK IMMEDIATE MEDICAL CARE IF:   There is increasing redness, swelling, or pain in or around a wound.  There is a red line that goes up your leg.  Pus is coming from a wound.  You develop a fever or as directed by your health care provider.  You notice a bad smell coming from an ulcer or wound. Document Released: 01/10/2000 Document Revised: 09/14/2012 Document Reviewed: 06/21/2012 [facility] Patient Information 2015 [place], [place]. This information is not intended to replace advice given to you by your health care provider. Make sure you discuss any questions you have with your health care provider.

## 2015-06-18 ENCOUNTER — Encounter: Payer: Self-pay | Admitting: Podiatry

## 2015-06-18 ENCOUNTER — Ambulatory Visit (INDEPENDENT_AMBULATORY_CARE_PROVIDER_SITE_OTHER): Payer: Medicare Other | Admitting: Podiatry

## 2015-06-18 VITALS — BP 144/88 | HR 69 | Resp 12

## 2015-06-18 DIAGNOSIS — B351 Tinea unguium: Secondary | ICD-10-CM

## 2015-06-18 DIAGNOSIS — M79676 Pain in unspecified toe(s): Secondary | ICD-10-CM

## 2015-06-18 DIAGNOSIS — E119 Type 2 diabetes mellitus without complications: Secondary | ICD-10-CM | POA: Diagnosis not present

## 2015-06-18 NOTE — Patient Instructions (Signed)
Today you describe a two-week history of a skin tear on your right foot/ankle. It does not appear infected. Apply topical antibiotic ointment such as triple antibiotic ointment at least once daily and cover with gauze until healed. If you notice any sudden pain, swelling, redness present to the emergency department  Diabetes and Foot Care Diabetes may cause you to have problems because of poor blood supply (circulation) to your feet and legs. This may cause the skin on your feet to become thinner, break easier, and heal more slowly. Your skin may become dry, and the skin may peel and crack. You may also have nerve damage in your legs and feet causing decreased feeling in them. You may not notice minor injuries to your feet that could lead to infections or more serious problems. Taking care of your feet is one of the most important things you can do for yourself.  HOME CARE INSTRUCTIONS  Wear shoes at all times, even in the house. Do not go barefoot. Bare feet are easily injured.  Check your feet daily for blisters, cuts, and redness. If you cannot see the bottom of your feet, use a mirror or ask someone for help.  Wash your feet with warm water (do not use hot water) and mild soap. Then pat your feet and the areas between your toes until they are completely dry. Do not soak your feet as this can dry your skin.  Apply a moisturizing lotion or petroleum jelly (that does not contain alcohol and is unscented) to the skin on your feet and to dry, brittle toenails. Do not apply lotion between your toes.  Trim your toenails straight across. Do not dig under them or around the cuticle. File the edges of your nails with an emery board or nail file.  Do not cut corns or calluses or try to remove them with medicine.  Wear clean socks or stockings every day. Make sure they are not too tight. Do not wear knee-high stockings since they may decrease blood flow to your legs.  Wear shoes that fit properly and  have enough cushioning. To break in new shoes, wear them for just a few hours a day. This prevents you from injuring your feet. Always look in your shoes before you put them on to be sure there are no objects inside.  Do not cross your legs. This may decrease the blood flow to your feet.  If you find a minor scrape, cut, or break in the skin on your feet, keep it and the skin around it clean and dry. These areas may be cleansed with mild soap and water. Do not cleanse the area with peroxide, alcohol, or iodine.  When you remove an adhesive bandage, be sure not to damage the skin around it.  If you have a wound, look at it several times a day to make sure it is healing.  Do not use heating pads or hot water bottles. They may burn your skin. If you have lost feeling in your feet or legs, you may not know it is happening until it is too late.  Make sure your health care provider performs a complete foot exam at least annually or more often if you have foot problems. Report any cuts, sores, or bruises to your health care provider immediately. SEEK MEDICAL CARE IF:   You have an injury that is not healing.  You have cuts or breaks in the skin.  You have an ingrown nail.  You notice  redness on your legs or feet.  You feel burning or tingling in your legs or feet.  You have pain or cramps in your legs and feet.  Your legs or feet are numb.  Your feet always feel cold. SEEK IMMEDIATE MEDICAL CARE IF:   There is increasing redness, swelling, or pain in or around a wound.  There is a red line that goes up your leg.  Pus is coming from a wound.  You develop a fever or as directed by your health care provider.  You notice a bad smell coming from an ulcer or wound.   This information is not intended to replace advice given to you by your health care provider. Make sure you discuss any questions you have with your health care provider.   Document Released: 01/10/2000 Document Revised:  09/14/2012 Document Reviewed: 06/21/2012 Elsevier Interactive Patient Education [employer].

## 2015-06-18 NOTE — Progress Notes (Signed)
   Subjective:    Patient ID: Robert Cortez, male    DOB: [birth year], 75 y.o.   MRN: [id number]  HPI  This patient presents today requesting a diabetic foot examination and debridement of toenails. He was last seen for this service on 06/18/2014. Since then he denies any claudication, amputation of foot ulceration. He says in the past 2 weeks when wearing a sandal and walking in [place] he developed a cut on the dorsal aspect of his right foot which he is treating with topical antibiotic ointment, and says that the wound is healing without any complaints at this time .   Review of Systems  Skin: Positive for color change.       Objective:   Physical Exam  Vascular: DP pulses 1/4 bilaterally PT pulses 2/4 bilaterally No peripheral edema noted bilaterally Capillary reflex immediate bilaterally  Neurological: Sensation to 10 g monofilament wire intact 5/5 bilaterally Ankle reflex equal and reactive bilaterally Vibratory sensation intact bilaterally  Dermatological On the dorsal medial foot/ankle right there is a 30 mm x 59mm  laceration with a granular base. There is no surrounding erythema, edema, warmth The second toenails are hypertrophic and deformed. 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 I made aware that he needs to apply topical antibiotic ointment and a gauze dressing on a daily basis until this wound on his right foot heels The mycotic toenails were debrided today without any bleeding  Reappoint at patient request or yearly

## 2016-06-17 ENCOUNTER — Ambulatory Visit: Payer: Medicare Other | Admitting: Podiatry

## 2016-10-28 ENCOUNTER — Ambulatory Visit (INDEPENDENT_AMBULATORY_CARE_PROVIDER_SITE_OTHER): Payer: Medicare Other | Admitting: Endocrinology

## 2016-10-28 ENCOUNTER — Encounter: Payer: Self-pay | Admitting: Endocrinology

## 2016-10-28 VITALS — BP 138/74 | HR 67 | Wt 189.2 lb

## 2016-10-28 DIAGNOSIS — E119 Type 2 diabetes mellitus without complications: Secondary | ICD-10-CM

## 2016-10-28 LAB — POCT GLYCOSYLATED HEMOGLOBIN (HGB A1C): HEMOGLOBIN A1C: 9.6

## 2016-10-28 MED ORDER — BROMOCRIPTINE MESYLATE 2.5 MG PO TABS: 1.2500 mg | ORAL_TABLET | Freq: Every day | ORAL | 11 refills | Status: DC

## 2016-10-28 NOTE — Progress Notes (Signed)
Subjective:    Patient ID: Robert Cortez, male    DOB: October 19, 1940, 76 y.o.   MRN: [id number]  HPI pt is referred by Dr Orland Penman, for diabetes.  Pt states DM was dx'ed in 1993; he has mild if any neuropathy of the lower extremities; he is unaware of any associated chronic complications; he took insulin approx 2004-2006; pt says his diet and exercise are good; he has never had pancreatitis, pancreatic surgery, severe hypoglycemia or DKA.  He has lost 40 lbs x 1 year.  He takes victoza and 3 oral meds.   Past Medical History:  Diagnosis Date  . Diabetes mellitus without complication ([place])   . Hypertension     Past Surgical History:  Procedure Laterality Date  . COLON SURGERY     Precancerous lesion    Social History   Social History  . Marital status: Married    Spouse name: N/A  . Number of children: N/A  . Years of education: N/A   Occupational History  . Not on file.   Social History Main Topics  . Smoking status: Never Smoker  . Smokeless tobacco: Never Used  . Alcohol use Yes  . Drug use: Unknown  . Sexual activity: Not on file   Other Topics Concern  . Not on file   Social History Narrative  . No narrative on file    Current Outpatient Prescriptions on File Prior to Visit  Medication Sig Dispense Refill  . amLODipine (NORVASC) 10 MG tablet Take by mouth.    Marland Kitchen aspirin 325 MG tablet Take by mouth.    Marland Kitchen glimepiride (AMARYL) 4 MG tablet     . glucose blood (ACCU-CHEK COMFORT CURVE) test strip     . INVOKAMET 50-1000 MG TABS TAKE ONE TABLET BY MOUTH TWICE DAILY 60 tablet 0  . losartan (COZAAR) 100 MG tablet     . metoprolol succinate (TOPROL-XL) 50 MG 24 hr tablet     . simvastatin (ZOCOR) 20 MG tablet     . VICTOZA 18 MG/3ML SOPN 1.8 mg daily.     No current facility-administered medications on file prior to visit.     Allergies  Allergen Reactions  . Bee Venom Anaphylaxis  . Shellfish Allergy Anaphylaxis and Hives    Family History  Problem Relation  Age of Onset  . Diabetes Mother   . Hypertension Mother     BP 138/74   Pulse 67   Wt 189 lb 3.2 oz (85.8 kg)   SpO2 98%   BMI 27.54 kg/m    Review of Systems denies fatigue, blurry vision, headache, chest pain, sob, n/v, urinary frequency, muscle cramps, excessive diaphoresis, memory loss, cold intolerance, rhinorrhea, and easy bruising.      Objective:   Physical Exam VS: see vs page GEN: no distress HEAD: head: no deformity eyes: no periorbital swelling, no proptosis external nose and ears are normal mouth: no lesion seen NECK: supple, thyroid is not enlarged CHEST WALL: no deformity LUNGS: clear to auscultation CV: reg rate and rhythm, no murmur ABD: abdomen is soft, nontender.  no hepatosplenomegaly.  not distended.  no hernia.  MUSCULOSKELETAL: muscle bulk and strength are grossly normal.  no obvious joint swelling.  gait is normal and steady.  EXTEMITIES: no deformity.  no ulcer on the feet.  feet are of normal color and temp.  1+ bilat leg edema.  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   PULSES: dorsalis pedis intact bilat.  no carotid bruit  NEURO:  cn 2-12 grossly intact.   readily moves all 4's.  sensation is intact to touch on the feet SKIN:  Normal texture and temperature.  No rash or suspicious lesion is visible.  Hypopigmented healed abrasions on the legs.   NODES:  None palpable at the neck PSYCH: alert, well-oriented.  Does not appear anxious nor depressed.   A1c=9.6%  I have reviewed outside records, and summarized: Pt was noted to have elevated a1c, and referred here.  He has had incompatibilities with several other providers in the past.       Assessment & Plan:  Type 2 DM: I advised pt to resume insulin.  he declines.  We discussed.  Edema, new to me: this limits oral rx options.     Patient Instructions  good diet and exercise significantly improve the control of your diabetes.  please let me know if you wish to be referred to a  dietician.  high blood sugar is very risky to your health.  you should see an eye doctor and dentist every year.  It is very important to get all recommended vaccinations.  Controlling your blood pressure and cholesterol drastically reduces the damage diabetes does to your body.  Those who smoke should quit.  Please discuss these with your doctor.  check your blood sugar once a day.  vary the time of day when you check, between before the 3 meals, and at bedtime.  also check if you have symptoms of your blood sugar being too high or too low.  please keep a record of the readings and bring it to your next appointment here (or you can bring the meter itself).  You can write it on any piece of paper.  please call us sooner if your blood sugar goes below 70, or if you have a lot of readings over 200.   Please start taking "bromocriptine," to help your blood sugar. It has possible side effects of nausea and dizziness.  These go away with time.  You can avoid these by taking it at bedtime, and by taking just take 1/4 pill for the first week.   If necessary, we can add "acarbose," and "welchol."   Please come back for a follow-up appointment in 2 months.

## 2016-10-28 NOTE — Patient Instructions (Addendum)
good diet and exercise significantly improve the control of your diabetes.  please let me know if you wish to be referred to a dietician.  high blood sugar is very risky to your health.  you should see an eye doctor and dentist every year.  It is very important to get all recommended vaccinations.  Controlling your blood pressure and cholesterol drastically reduces the damage diabetes does to your body.  Those who smoke should quit.  Please discuss these with your doctor.  check your blood sugar once a day.  vary the time of day when you check, between before the 3 meals, and at bedtime.  also check if you have symptoms of your blood sugar being too high or too low.  please keep a record of the readings and bring it to your next appointment here (or you can bring the meter itself).  You can write it on any piece of paper.  please call us sooner if your blood sugar goes below 70, or if you have a lot of readings over 200.   Please start taking "bromocriptine," to help your blood sugar. It has possible side effects of nausea and dizziness.  These go away with time.  You can avoid these by taking it at bedtime, and by taking just take 1/4 pill for the first week.   If necessary, we can add "acarbose," and "welchol."   Please come back for a follow-up appointment in 2 months.

## 2016-11-17 ENCOUNTER — Ambulatory Visit: Payer: Medicare Other | Admitting: Podiatry

## 2016-11-18 ENCOUNTER — Ambulatory Visit: Payer: Medicare Other | Admitting: Podiatry

## 2016-12-28 ENCOUNTER — Ambulatory Visit: Payer: Medicare Other | Admitting: Endocrinology

## 2018-09-10 ENCOUNTER — Other Ambulatory Visit: Payer: Self-pay

## 2018-09-10 DIAGNOSIS — Z20822 Contact with and (suspected) exposure to covid-19: Secondary | ICD-10-CM

## 2018-09-11 LAB — NOVEL CORONAVIRUS, NAA: SARS-CoV-2, NAA: NOT DETECTED

## 2018-09-13 ENCOUNTER — Telehealth: Payer: Self-pay | Admitting: *Deleted

## 2018-09-13 NOTE — Telephone Encounter (Signed)
Patient returning call for results- notified negative. Patient was screening and has no symptoms. Advised to seek care if he has changes/symptoms/retest.

## 2018-12-19 ENCOUNTER — Other Ambulatory Visit: Payer: Self-pay

## 2018-12-19 DIAGNOSIS — Z20822 Contact with and (suspected) exposure to covid-19: Secondary | ICD-10-CM

## 2018-12-21 LAB — NOVEL CORONAVIRUS, NAA: SARS-CoV-2, NAA: NOT DETECTED

## 2019-04-19 ENCOUNTER — Ambulatory Visit: Payer: Self-pay | Admitting: Physician Assistant

## 2019-04-19 ENCOUNTER — Other Ambulatory Visit: Payer: Self-pay

## 2019-04-19 VITALS — BP 157/86 | HR 71 | Temp 98.2°F | Resp 18 | Ht 69.5 in | Wt 187.0 lb

## 2019-04-19 DIAGNOSIS — E119 Type 2 diabetes mellitus without complications: Secondary | ICD-10-CM

## 2019-04-19 DIAGNOSIS — G8929 Other chronic pain: Secondary | ICD-10-CM

## 2019-04-19 DIAGNOSIS — E1165 Type 2 diabetes mellitus with hyperglycemia: Secondary | ICD-10-CM

## 2019-04-19 DIAGNOSIS — I10 Essential (primary) hypertension: Secondary | ICD-10-CM

## 2019-04-19 LAB — POCT GLYCOSYLATED HEMOGLOBIN (HGB A1C): Hemoglobin A1C: 11.1 % — AB (ref 4.0–5.6)

## 2019-04-19 LAB — GLUCOSE, POCT (MANUAL RESULT ENTRY): POC Glucose: 367 mg/dl — AB (ref 70–99)

## 2019-04-19 NOTE — Progress Notes (Signed)
Patient took medication around 9:30am. Patient has eaten today around 10:00am

## 2019-04-19 NOTE — Progress Notes (Signed)
New Patient Office Visit  Subjective:  Patient ID: Robert Cortez, male    DOB: 04-05-1940  Age: 79 y.o. MRN: [id number]  CC:  Chief Complaint  Patient presents with  . Diabetes  . Hypertension    HPI Robert Cortez reports that he is being treated for hypertension and diabetes, states that he is compliant to his medication regimen.  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  Reports that he previously was seen by endocrinology, but has not been seen in a couple of years, would like to have a referral to a new endocrinologist.  Reports last time he was seen by his primary care provider was last year before the pandemic started, states that he has had a couple of virtual visits with them for medication refills.  Reports that his last office visit his primary care provider did encourage him to work on diet and exercise to help with his A1c  Reports that he is getting ready to start back exercising at the [facility], states that he is fully vaccinated and feels comfortable being in public.  Reports that he does not check his blood pressure, does have a blood pressure cuff at home.  Does not follow a low-sodium diet  Reports that he has been having pain in his right shoulder, states it is worse in the morning, when it is raining or limited cold, denies injury or trauma, states he was not a previous athlete, is right-handed, does not do repetitive type work.  Reports that he has tried over-the-counter pain patches and Aspercreme with some relief.  Does not feel the shoulder pain is debilitating   Past Medical History:  Diagnosis Date  . Diabetes mellitus without complication ([place])   . Hypertension     Past Surgical History:  Procedure Laterality Date  . COLON SURGERY     Precancerous lesion     Family History  Problem Relation Age of Onset  . Diabetes Mother   . Hypertension Mother     Social History   Socioeconomic History  . Marital status: Married    Spouse name: Not on file  . Number of children: Not on file  . Years of education: Not on file  . Highest education level: Not on file  Occupational History  . Not on file  Tobacco Use  . Smoking status: Never Smoker  . Smokeless tobacco: Never Used  Substance and Sexual Activity  . Alcohol use: Yes  . Drug use: Never  . Sexual activity: Not Currently  Other Topics Concern  . Not on file  Social History Narrative  . Not on file   Social Determinants of Health   Financial Resource Strain:   . Difficulty of Paying Living Expenses:   Food Insecurity:   . Worried About Charity fundraiser in the Last Year:   . Arboriculturist in the Last Year:   Transportation Needs:   . Film/video editor (Medical):   Marland Kitchen Lack of Transportation (Non-Medical):   Physical Activity:   . Days of Exercise per Week:   . Minutes of Exercise per Session:   Stress:   . Feeling of Stress :   Social Connections:   . Frequency of Communication with Friends and Family:   .  Frequency of Social Gatherings with Friends and Family:   . Attends Religious Services:   . Active Member of Clubs or Organizations:   . Attends Archivist Meetings:   Marland Kitchen Marital Status:   Intimate Partner Violence:   . Fear of Current or Ex-Partner:   . Emotionally Abused:   Marland Kitchen Physically Abused:   . Sexually Abused:     ROS Review of Systems  Constitutional: Negative.   HENT: Negative.   Eyes: Negative.   Respiratory: Negative for chest tightness and shortness of breath.   Cardiovascular: Negative for chest pain and palpitations.  Gastrointestinal: Negative.   Endocrine: Negative.   Genitourinary: Negative.   Musculoskeletal: Positive for arthralgias, neck pain and neck stiffness. Negative for joint swelling.  Skin: Negative.    Allergic/Immunologic: Negative.   Neurological: Negative.   Hematological: Negative.   Psychiatric/Behavioral: Negative.     Objective:   Today's Vitals: BP (!) 157/86 (BP Location: Left Arm, Patient Position: Sitting, Cuff Size: Normal)   Pulse 71   Temp 98.2 F (36.8 C) (Oral)   Resp 18   Ht 5' 9.5" (1.765 m)   Wt 187 lb (84.8 kg)   SpO2 97%   BMI 27.22 kg/m   Physical Exam Constitutional:      Appearance: Normal appearance.  HENT:     Head: Normocephalic and atraumatic.     Right Ear: External ear normal.     Left Ear: External ear normal.     Nose: Nose normal.     Mouth/Throat:     Mouth: Mucous membranes are dry.     Pharynx: Oropharynx is clear.  Eyes:     Extraocular Movements: Extraocular movements intact.     Conjunctiva/sclera: Conjunctivae normal.     Pupils: Pupils are equal, round, and reactive to light.  Cardiovascular:     Rate and Rhythm: Normal rate and regular rhythm.     Pulses: Normal pulses.     Heart sounds: Normal heart sounds.  Pulmonary:     Effort: Pulmonary effort is normal.     Breath sounds: Normal breath sounds.  Abdominal:     General: Abdomen is flat. Bowel sounds are normal.     Palpations: Abdomen is soft.  Musculoskeletal:     Right shoulder: No swelling, deformity, tenderness, bony tenderness or crepitus. Decreased range of motion. Normal strength.     Left shoulder: Normal.     Cervical back: Normal range of motion.  Skin:    General: Skin is warm and dry.  Neurological:     General: No focal deficit present.     Mental Status: He is alert and oriented to person, place, and time.  Psychiatric:        Mood and Affect: Mood normal.        Behavior: Behavior normal.        Thought Content: Thought content normal.        Judgment: Judgment normal.     Assessment & Plan:   Problem List Items Addressed This Visit      Cardiovascular and Mediastinum   Essential hypertension, benign     Endocrine   Diabetes ([place])    Relevant Orders   HgB A1c (Completed)   Glucose (CBG) (Completed)    Other Visit Diagnoses    Uncontrolled type 2 diabetes mellitus with hyperglycemia ([place])    -  Primary   Relevant Orders   Ambulatory referral to Endocrinology   Chronic right shoulder pain  Outpatient Encounter Medications as of 04/19/2019  Medication Sig  . amLODipine (NORVASC) 10 MG tablet Take by mouth.  Marland Kitchen aspirin 325 MG tablet Take by mouth.  Marland Kitchen glimepiride (AMARYL) 4 MG tablet   . glucose blood (ACCU-CHEK COMFORT CURVE) test strip   . INVOKAMET 50-1000 MG TABS TAKE ONE TABLET BY MOUTH TWICE DAILY  . losartan (COZAAR) 100 MG tablet   . metoprolol succinate (TOPROL-XL) 50 MG 24 hr tablet   . simvastatin (ZOCOR) 20 MG tablet   . VICTOZA 18 MG/3ML SOPN 1.8 mg daily.  . [DISCONTINUED] bromocriptine (PARLODEL) 2.5 MG tablet Take 0.5 tablets (1.25 mg total) by mouth daily.   No facility-administered encounter medications on file as of 04/19/2019.  1. Type 2 diabetes mellitus without complication, without long-term current use of insulin ([place]) Reviewed patient's current medications, patient is currently at maximum dosing, patient endorses he is compliant to his medications, has previously seen endocrinology.  Will refer patient to endocrinologist, encouraged patient to check blood glucose twice daily before breakfast and dinner, keep log and take it to medical visits.  Strongly encourage patient to lower simple carbs in diet, gave patient education on Mediterranean style diet, limit eating in restaurants or working on making healthier choices in restaurants.  Patient had elevated blood glucose on arrival, states that he had just eaten some bacon, a blueberry muffin and 2% milk.  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  Patient denied any hyper glycemic symptoms.  - HgB A1c - Glucose (CBG)  2. Uncontrolled type 2 diabetes mellitus with  hyperglycemia ([place])  - Ambulatory referral to Endocrinology  3. Essential hypertension, benign Gave patient education on a low-sodium diet, encourage patient to check blood pressure on a daily basis, take blood pressure log to office visit with his medical provider  4. Chronic right shoulder pain Encouraged patient to continue supportive care, trial of glucosamine over-the-counter, work on stretching exercises as tolerated.  Follow-up: Return if symptoms worsen or fail to improve.   Loraine Grip Dontavian Marchi, PA-C

## 2019-04-19 NOTE — Patient Instructions (Signed)
I have sent a referral for your to see Delrae Rend, MD to help manage your diabetes  Please work on reducing the sugar and flour in your diet, keep a log of your twice daily blood sugar readings and check your blood pressure each day and add to log as well.  Take this with you to your medical providers   Mediterranean Diet A Mediterranean diet refers to food and lifestyle choices that are based on the traditions of countries located on the [employer]. This way of eating has been shown to help prevent certain conditions and improve outcomes for people who have chronic diseases, like kidney disease and heart disease. What are tips for following this plan? Lifestyle  Cook and eat meals together with your family, when possible.  Drink enough fluid to keep your urine clear or pale yellow.  Be physically active every day. This includes: ? Aerobic exercise like running or swimming. ? Leisure activities like gardening, walking, or housework.  Get 7-8 hours of sleep each night.  If recommended by your health care provider, drink red wine in moderation. This means 1 glass a day for nonpregnant women and 2 glasses a day for men. A glass of wine equals 5 oz (150 mL). Reading food labels   Check the serving size of packaged foods. For foods such as rice and pasta, the serving size refers to the amount of cooked product, not dry.  Check the total fat in packaged foods. Avoid foods that have saturated fat or trans fats.  Check the ingredients list for added sugars, such as corn syrup. Shopping  At the grocery store, buy most of your food from the areas near the walls of the store. This includes: ? Fresh fruits and vegetables (produce). ? Grains, beans, nuts, and seeds. Some of these may be available in unpackaged forms or large amounts (in bulk). ? Fresh seafood. ? Poultry and eggs. ? Low-fat dairy products.  Buy whole ingredients instead of prepackaged foods.  Buy fresh fruits and  vegetables in-season from local farmers markets.  Buy frozen fruits and vegetables in resealable bags.  If you do not have access to quality fresh seafood, buy precooked frozen shrimp or canned fish, such as tuna, salmon, or sardines.  Buy small amounts of raw or cooked vegetables, salads, or olives from the deli or salad bar at your store.  Stock your pantry so you always have certain foods on hand, such as olive oil, canned tuna, canned tomatoes, rice, pasta, and beans. Cooking  Cook foods with extra-virgin olive oil instead of using butter or other vegetable oils.  Have meat as a side dish, and have vegetables or grains as your main dish. This means having meat in small portions or adding small amounts of meat to foods like pasta or stew.  Use beans or vegetables instead of meat in common dishes like chili or lasagna.  Experiment with different cooking methods. Try roasting or broiling vegetables instead of steaming or sauteing them.  Add frozen vegetables to soups, stews, pasta, or rice.  Add nuts or seeds for added healthy fat at each meal. You can add these to yogurt, salads, or vegetable dishes.  Marinate fish or vegetables using olive oil, lemon juice, garlic, and fresh herbs. Meal planning   Plan to eat 1 vegetarian meal one day each week. Try to work up to 2 vegetarian meals, if possible.  Eat seafood 2 or more times a week.  Have healthy snacks readily available, such  as: ? Vegetable sticks with hummus. ? [place] yogurt. ? Fruit and nut trail mix.  Eat balanced meals throughout the week. This includes: ? Fruit: 2-3 servings a day ? Vegetables: 4-5 servings a day ? Low-fat dairy: 2 servings a day ? Fish, poultry, or lean meat: 1 serving a day ? Beans and legumes: 2 or more servings a week ? Nuts and seeds: 1-2 servings a day ? Whole grains: 6-8 servings a day ? Extra-virgin olive oil: 3-4 servings a day  Limit red meat and sweets to only a few servings a  month What are my food choices?  Mediterranean diet ? Recommended  Grains: Whole-grain pasta. Brown rice. Bulgar wheat. Polenta. Couscous. Whole-wheat bread. [place].  Vegetables: Artichokes. Beets. Broccoli. Cabbage. Carrots. Eggplant. Green beans. Chard. Kale. Spinach. Onions. Leeks. Peas. Squash. Tomatoes. Peppers. Radishes.  Fruits: Apples. Apricots. Avocado. Berries. Bananas. Cherries. Dates. Figs. Grapes. Lemons. Melon. Oranges. Peaches. Plums. Pomegranate.  Meats and other protein foods: Beans. Almonds. Sunflower seeds. Pine nuts. Peanuts. [place]. Salmon. Scallops. Shrimp. [place]. Tilapia. Clams. Oysters. Eggs.  Dairy: Low-fat milk. Cheese. Greek yogurt.  Beverages: Water. Red wine. Herbal tea.  Fats and oils: Extra virgin olive oil. Avocado oil. Grape seed oil.  Sweets and desserts: [place] yogurt with honey. Baked apples. Poached pears. Trail mix.  Seasoning and other foods: Basil. Cilantro. Coriander. Cumin. Mint. Parsley. Sage. Rosemary. Tarragon. Garlic. Oregano. Thyme. Pepper. Balsalmic vinegar. Tahini. Hummus. Tomato sauce. Olives. Mushrooms. ? Limit these  Grains: Prepackaged pasta or rice dishes. Prepackaged cereal with added sugar.  Vegetables: Deep fried potatoes (french fries).  Fruits: Fruit canned in syrup.  Meats and other protein foods: Beef. Pork. Lamb. Poultry with skin. Hot dogs. Berniece Salines.  Dairy: Ice cream. Sour cream. Whole milk.  Beverages: Juice. Sugar-sweetened soft drinks. Beer. Liquor and spirits.  Fats and oils: Butter. Canola oil. Vegetable oil. Beef fat (tallow). Lard.  Sweets and desserts: Cookies. Cakes. Pies. Candy.  Seasoning and other foods: Mayonnaise. Premade sauces and marinades. The items listed may not be a complete list. Talk with your dietitian about what dietary choices are right for you. Summary  The Mediterranean diet includes both food and lifestyle choices.  Eat a variety of fresh fruits and vegetables, beans, nuts,  seeds, and whole grains.  Limit the amount of red meat and sweets that you eat.  Talk with your health care provider about whether it is safe for you to drink red wine in moderation. This means 1 glass a day for nonpregnant women and 2 glasses a day for men. A glass of wine equals 5 oz (150 mL). This information is not intended to replace advice given to you by your health care provider. Make sure you discuss any questions you have with your health care provider. Document Revised: 09/12/2015 Document Reviewed: 09/05/2015 Elsevier Patient Education  [place].

## 2019-05-25 ENCOUNTER — Other Ambulatory Visit (HOSPITAL_COMMUNITY): Payer: Self-pay | Admitting: Family Medicine

## 2019-05-25 DIAGNOSIS — R0989 Other specified symptoms and signs involving the circulatory and respiratory systems: Secondary | ICD-10-CM

## 2019-05-30 ENCOUNTER — Other Ambulatory Visit: Payer: Self-pay

## 2019-05-30 ENCOUNTER — Ambulatory Visit (HOSPITAL_COMMUNITY)
Admission: RE | Admit: 2019-05-30 | Discharge: 2019-05-30 | Disposition: A | Discharge status: Home / Self Care | Payer: Medicare PPO | Source: Ambulatory Visit | Attending: Family Medicine | Admitting: Family Medicine

## 2019-05-30 DIAGNOSIS — R0989 Other specified symptoms and signs involving the circulatory and respiratory systems: Secondary | ICD-10-CM | POA: Insufficient documentation

## 2019-05-30 NOTE — Progress Notes (Signed)
ABI completed. Refer to "CV Proc" under chart review to view preliminary results.  05/30/2019 3:55 PM Kelby Aline., MHA, RVT, RDCS, RDMS

## 2019-08-16 DIAGNOSIS — Z7982 Long term (current) use of aspirin: Secondary | ICD-10-CM | POA: Diagnosis not present

## 2019-08-16 DIAGNOSIS — Z79899 Other long term (current) drug therapy: Secondary | ICD-10-CM | POA: Diagnosis not present

## 2019-08-16 DIAGNOSIS — E113293 Type 2 diabetes mellitus with mild nonproliferative diabetic retinopathy without macular edema, bilateral: Secondary | ICD-10-CM | POA: Diagnosis not present

## 2019-08-16 DIAGNOSIS — Z794 Long term (current) use of insulin: Secondary | ICD-10-CM | POA: Diagnosis not present

## 2019-08-16 DIAGNOSIS — E1165 Type 2 diabetes mellitus with hyperglycemia: Secondary | ICD-10-CM | POA: Diagnosis not present

## 2019-08-16 DIAGNOSIS — Z8673 Personal history of transient ischemic attack (TIA), and cerebral infarction without residual deficits: Secondary | ICD-10-CM | POA: Diagnosis not present

## 2019-08-16 DIAGNOSIS — I10 Essential (primary) hypertension: Secondary | ICD-10-CM | POA: Diagnosis not present

## 2019-08-16 DIAGNOSIS — E118 Type 2 diabetes mellitus with unspecified complications: Secondary | ICD-10-CM | POA: Diagnosis not present

## 2019-08-16 DIAGNOSIS — E11319 Type 2 diabetes mellitus with unspecified diabetic retinopathy without macular edema: Secondary | ICD-10-CM | POA: Diagnosis not present

## 2019-09-19 DIAGNOSIS — E1165 Type 2 diabetes mellitus with hyperglycemia: Secondary | ICD-10-CM | POA: Diagnosis not present

## 2019-09-19 DIAGNOSIS — Z5181 Encounter for therapeutic drug level monitoring: Secondary | ICD-10-CM | POA: Diagnosis not present

## 2019-09-19 DIAGNOSIS — E118 Type 2 diabetes mellitus with unspecified complications: Secondary | ICD-10-CM | POA: Diagnosis not present

## 2019-11-01 DIAGNOSIS — Z794 Long term (current) use of insulin: Secondary | ICD-10-CM | POA: Diagnosis not present

## 2019-11-01 DIAGNOSIS — E118 Type 2 diabetes mellitus with unspecified complications: Secondary | ICD-10-CM | POA: Diagnosis not present

## 2019-11-01 DIAGNOSIS — E1165 Type 2 diabetes mellitus with hyperglycemia: Secondary | ICD-10-CM | POA: Diagnosis not present

## 2019-11-01 DIAGNOSIS — E113293 Type 2 diabetes mellitus with mild nonproliferative diabetic retinopathy without macular edema, bilateral: Secondary | ICD-10-CM | POA: Diagnosis not present

## 2020-02-23 DIAGNOSIS — E118 Type 2 diabetes mellitus with unspecified complications: Secondary | ICD-10-CM | POA: Diagnosis not present

## 2020-04-16 DIAGNOSIS — Z794 Long term (current) use of insulin: Secondary | ICD-10-CM | POA: Diagnosis not present

## 2020-04-16 DIAGNOSIS — E1169 Type 2 diabetes mellitus with other specified complication: Secondary | ICD-10-CM | POA: Diagnosis not present

## 2020-04-16 DIAGNOSIS — Z5181 Encounter for therapeutic drug level monitoring: Secondary | ICD-10-CM | POA: Diagnosis not present

## 2020-04-16 DIAGNOSIS — I10 Essential (primary) hypertension: Secondary | ICD-10-CM | POA: Diagnosis not present

## 2020-04-16 DIAGNOSIS — E1165 Type 2 diabetes mellitus with hyperglycemia: Secondary | ICD-10-CM | POA: Diagnosis not present

## 2020-04-16 DIAGNOSIS — E118 Type 2 diabetes mellitus with unspecified complications: Secondary | ICD-10-CM | POA: Diagnosis not present

## 2020-05-31 DIAGNOSIS — E118 Type 2 diabetes mellitus with unspecified complications: Secondary | ICD-10-CM | POA: Diagnosis not present

## 2020-08-02 DIAGNOSIS — L255 Unspecified contact dermatitis due to plants, except food: Secondary | ICD-10-CM | POA: Diagnosis not present

## 2020-08-14 DIAGNOSIS — Z0000 Encounter for general adult medical examination without abnormal findings: Secondary | ICD-10-CM | POA: Diagnosis not present

## 2020-08-14 DIAGNOSIS — Z85038 Personal history of other malignant neoplasm of large intestine: Secondary | ICD-10-CM | POA: Insufficient documentation

## 2020-08-14 DIAGNOSIS — Z86010 Personal history of colonic polyps: Secondary | ICD-10-CM | POA: Diagnosis not present

## 2020-08-14 DIAGNOSIS — R0989 Other specified symptoms and signs involving the circulatory and respiratory systems: Secondary | ICD-10-CM | POA: Insufficient documentation

## 2020-08-14 DIAGNOSIS — R972 Elevated prostate specific antigen [PSA]: Secondary | ICD-10-CM | POA: Insufficient documentation

## 2020-08-14 DIAGNOSIS — E1165 Type 2 diabetes mellitus with hyperglycemia: Secondary | ICD-10-CM | POA: Insufficient documentation

## 2020-08-14 DIAGNOSIS — E119 Type 2 diabetes mellitus without complications: Secondary | ICD-10-CM | POA: Diagnosis not present

## 2020-08-14 DIAGNOSIS — Z1389 Encounter for screening for other disorder: Secondary | ICD-10-CM | POA: Diagnosis not present

## 2020-08-14 DIAGNOSIS — Z794 Long term (current) use of insulin: Secondary | ICD-10-CM | POA: Diagnosis not present

## 2020-08-14 DIAGNOSIS — E785 Hyperlipidemia, unspecified: Secondary | ICD-10-CM | POA: Diagnosis not present

## 2020-08-14 DIAGNOSIS — I10 Essential (primary) hypertension: Secondary | ICD-10-CM | POA: Diagnosis not present

## 2020-09-04 DIAGNOSIS — E113293 Type 2 diabetes mellitus with mild nonproliferative diabetic retinopathy without macular edema, bilateral: Secondary | ICD-10-CM | POA: Diagnosis not present

## 2020-09-04 DIAGNOSIS — Z794 Long term (current) use of insulin: Secondary | ICD-10-CM | POA: Diagnosis not present

## 2020-09-05 DIAGNOSIS — E118 Type 2 diabetes mellitus with unspecified complications: Secondary | ICD-10-CM | POA: Diagnosis not present

## 2020-10-01 DIAGNOSIS — H35341 Macular cyst, hole, or pseudohole, right eye: Secondary | ICD-10-CM | POA: Diagnosis not present

## 2020-10-01 DIAGNOSIS — Z794 Long term (current) use of insulin: Secondary | ICD-10-CM | POA: Diagnosis not present

## 2020-10-01 DIAGNOSIS — E113293 Type 2 diabetes mellitus with mild nonproliferative diabetic retinopathy without macular edema, bilateral: Secondary | ICD-10-CM | POA: Diagnosis not present

## 2020-10-01 DIAGNOSIS — H35371 Puckering of macula, right eye: Secondary | ICD-10-CM | POA: Diagnosis not present

## 2020-10-01 DIAGNOSIS — Z961 Presence of intraocular lens: Secondary | ICD-10-CM | POA: Diagnosis not present

## 2020-12-16 DIAGNOSIS — E118 Type 2 diabetes mellitus with unspecified complications: Secondary | ICD-10-CM | POA: Diagnosis not present

## 2021-03-10 DIAGNOSIS — Z0000 Encounter for general adult medical examination without abnormal findings: Secondary | ICD-10-CM | POA: Diagnosis not present

## 2021-03-10 DIAGNOSIS — Z794 Long term (current) use of insulin: Secondary | ICD-10-CM | POA: Diagnosis not present

## 2021-03-10 DIAGNOSIS — E785 Hyperlipidemia, unspecified: Secondary | ICD-10-CM | POA: Diagnosis not present

## 2021-03-10 DIAGNOSIS — I10 Essential (primary) hypertension: Secondary | ICD-10-CM | POA: Diagnosis not present

## 2021-03-10 DIAGNOSIS — E119 Type 2 diabetes mellitus without complications: Secondary | ICD-10-CM | POA: Diagnosis not present

## 2021-03-13 DIAGNOSIS — Z794 Long term (current) use of insulin: Secondary | ICD-10-CM | POA: Diagnosis not present

## 2021-03-13 DIAGNOSIS — E1165 Type 2 diabetes mellitus with hyperglycemia: Secondary | ICD-10-CM | POA: Diagnosis not present

## 2021-03-13 DIAGNOSIS — E119 Type 2 diabetes mellitus without complications: Secondary | ICD-10-CM | POA: Diagnosis not present

## 2021-05-06 DIAGNOSIS — Z86010 Personal history of colonic polyps: Secondary | ICD-10-CM | POA: Diagnosis not present

## 2021-05-06 DIAGNOSIS — Z800 Family history of malignant neoplasm of digestive organs: Secondary | ICD-10-CM | POA: Diagnosis not present

## 2021-05-06 DIAGNOSIS — Z8371 Family history of colonic polyps: Secondary | ICD-10-CM | POA: Diagnosis not present

## 2021-05-20 DIAGNOSIS — E118 Type 2 diabetes mellitus with unspecified complications: Secondary | ICD-10-CM | POA: Diagnosis not present

## 2021-08-06 DIAGNOSIS — E1165 Type 2 diabetes mellitus with hyperglycemia: Secondary | ICD-10-CM | POA: Diagnosis not present

## 2021-08-06 DIAGNOSIS — I10 Essential (primary) hypertension: Secondary | ICD-10-CM | POA: Diagnosis not present

## 2021-08-06 DIAGNOSIS — Z794 Long term (current) use of insulin: Secondary | ICD-10-CM | POA: Diagnosis not present

## 2021-08-08 DIAGNOSIS — E118 Type 2 diabetes mellitus with unspecified complications: Secondary | ICD-10-CM | POA: Diagnosis not present

## 2021-08-11 ENCOUNTER — Ambulatory Visit: Admission: RE | Admit: 2021-08-11 | Payer: Medicare PPO | Source: Home / Self Care | Admitting: Gastroenterology

## 2021-08-11 ENCOUNTER — Encounter: Admission: RE | Payer: Self-pay | Source: Home / Self Care

## 2021-08-11 SURGERY — COLONOSCOPY
Anesthesia: General

## 2021-09-08 DIAGNOSIS — E1122 Type 2 diabetes mellitus with diabetic chronic kidney disease: Secondary | ICD-10-CM | POA: Diagnosis not present

## 2021-09-08 DIAGNOSIS — I129 Hypertensive chronic kidney disease with stage 1 through stage 4 chronic kidney disease, or unspecified chronic kidney disease: Secondary | ICD-10-CM | POA: Diagnosis not present

## 2021-09-08 DIAGNOSIS — E785 Hyperlipidemia, unspecified: Secondary | ICD-10-CM | POA: Diagnosis not present

## 2021-09-08 DIAGNOSIS — N189 Chronic kidney disease, unspecified: Secondary | ICD-10-CM | POA: Diagnosis not present

## 2021-09-08 DIAGNOSIS — Z794 Long term (current) use of insulin: Secondary | ICD-10-CM | POA: Diagnosis not present

## 2021-09-09 DIAGNOSIS — Z800 Family history of malignant neoplasm of digestive organs: Secondary | ICD-10-CM | POA: Diagnosis not present

## 2021-09-09 DIAGNOSIS — Z85038 Personal history of other malignant neoplasm of large intestine: Secondary | ICD-10-CM | POA: Diagnosis not present

## 2021-09-30 ENCOUNTER — Encounter: Payer: Self-pay | Admitting: Podiatry

## 2021-09-30 ENCOUNTER — Ambulatory Visit: Payer: Medicare PPO | Admitting: Podiatry

## 2021-09-30 DIAGNOSIS — B351 Tinea unguium: Secondary | ICD-10-CM

## 2021-09-30 DIAGNOSIS — S91209A Unspecified open wound of unspecified toe(s) with damage to nail, initial encounter: Secondary | ICD-10-CM | POA: Diagnosis not present

## 2021-09-30 DIAGNOSIS — E1151 Type 2 diabetes mellitus with diabetic peripheral angiopathy without gangrene: Secondary | ICD-10-CM | POA: Diagnosis not present

## 2021-09-30 MED ORDER — CICLOPIROX 8 % EX SOLN: Freq: Every day | CUTANEOUS | 0 refills | Status: DC

## 2021-09-30 MED ORDER — MUPIROCIN 2 % EX OINT: 1.0000 | TOPICAL_OINTMENT | Freq: Every day | CUTANEOUS | 0 refills | Status: DC

## 2021-09-30 NOTE — Patient Instructions (Signed)
Call [phone] to schedule your vascular testing:  [facility] [address], [place], [place] [address]  

## 2021-09-30 NOTE — Progress Notes (Signed)
  Subjective:  Patient ID: Robert Cortez, male    DOB: [birth year],  MRN: [id number]  Chief Complaint  Patient presents with   Diabetes    NP  diabetic foot care, A1C 7.5   Nail Problem    Thick painful toenails    81 y.o. male presents with the above complaint. History confirmed with patient. The nails are thickened elongated and causing pain.  He had the left great toenail get caught and partially removed recently.  He would like to be treated for nail fungus  Objective:  Physical Exam:  dystrophic yellowed discolored nail plates with subungual debris, he has nonpalpable DP or PT pulses, the toes are cool to touch, warm at the ankles, previous avulsion of the left hallux nail plate with dark discoloration and dry skin, no signs of infection exposed bone tendon or joint, nailbed is exposed   Assessment:   1. Onychomycosis   2. Traumatic avulsion of nail plate of toe, initial encounter   3. Type 2 diabetes mellitus with diabetic peripheral angiopathy without gangrene, without long-term current use of insulin ([place])      Plan:  Patient was evaluated and treated and all questions answered.  Patient educated on diabetes. Discussed proper diabetic foot care and discussed risks and complications of disease. Educated patient in depth on reasons to return to the office immediately should he/she discover anything concerning or new on the feet. All questions answered. Discussed proper shoes as well.   Discussed the etiology and treatment options for the condition in detail with the patient. Educated patient on the topical and oral treatment options for mycotic nails. Recommended debridement of the nails today. Sharp and mechanical debridement performed of all painful and mycotic nails today. Nails debrided in length and thickness using a nail nipper to level of comfort.  Rx ciclopirox sent to pharmacy for topical treatment  He also had an avulsion of the left hallux nail plate.  The nailbed  is exposed and appears to be healing poorly.  He has nonpalpable pulses and toes were cool to touch.  I suspect he has some vascular disease in conjunction with his diabetes.  I recommend noninvasive vascular testing which he has not had recently.  Referral for this was sent to be vascular surgery in [place]  Return in about 1 month (around 10/30/2021) for follow up left great toenail, circulation testing.

## 2021-10-07 DIAGNOSIS — H35341 Macular cyst, hole, or pseudohole, right eye: Secondary | ICD-10-CM | POA: Diagnosis not present

## 2021-10-07 DIAGNOSIS — H35373 Puckering of macula, bilateral: Secondary | ICD-10-CM | POA: Diagnosis not present

## 2021-10-07 DIAGNOSIS — E113393 Type 2 diabetes mellitus with moderate nonproliferative diabetic retinopathy without macular edema, bilateral: Secondary | ICD-10-CM | POA: Diagnosis not present

## 2021-10-07 DIAGNOSIS — Z794 Long term (current) use of insulin: Secondary | ICD-10-CM | POA: Diagnosis not present

## 2021-10-10 ENCOUNTER — Ambulatory Visit (HOSPITAL_COMMUNITY)
Admission: RE | Admit: 2021-10-10 | Discharge: 2021-10-10 | Disposition: A | Discharge status: Home / Self Care | Payer: Medicare PPO | Source: Ambulatory Visit | Attending: Podiatry | Admitting: Podiatry

## 2021-10-10 DIAGNOSIS — E1151 Type 2 diabetes mellitus with diabetic peripheral angiopathy without gangrene: Secondary | ICD-10-CM | POA: Diagnosis not present

## 2021-10-30 ENCOUNTER — Ambulatory Visit: Payer: Medicare PPO | Admitting: Podiatry

## 2021-11-04 ENCOUNTER — Ambulatory Visit: Payer: Medicare PPO | Admitting: Podiatry

## 2021-11-04 DIAGNOSIS — S91209A Unspecified open wound of unspecified toe(s) with damage to nail, initial encounter: Secondary | ICD-10-CM

## 2021-11-04 DIAGNOSIS — I739 Peripheral vascular disease, unspecified: Secondary | ICD-10-CM

## 2021-11-09 NOTE — Progress Notes (Signed)
  Subjective:  Patient ID: Robert Cortez, male    DOB: 11/23/1940,  MRN: [id number]  Chief Complaint  Patient presents with   Nail Problem    Follow up left great toenail    81 y.o. male presents with the above complaint. History confirmed with patient.   Objective:  Physical Exam:  dystrophic yellowed discolored nail plates with subungual debris, he has nonpalpable DP or PT pulses, the toes are cool to touch, warm at the ankles, previous avulsion of the left hallux nail plate prior avulsion appears to be healing well  Proximal testing shows good waveform some dampening at level of ankle and digit, left TBI is normal Assessment:   1. Traumatic avulsion of nail plate of toe, initial encounter   2. PAD (peripheral artery disease) ([place])       Plan:  Patient was evaluated and treated and all questions answered.  We reviewed his vascular testing.  He likely has small vessel disease.  Toenail appears to be healing well.  He will return in 2 months for at risk foot care and notify me if any evidence of nonhealing or infection develops before then  Return in about 2 months (around 01/04/2022) for nail re-check, at risk diabetic foot care.

## 2021-11-11 DIAGNOSIS — E118 Type 2 diabetes mellitus with unspecified complications: Secondary | ICD-10-CM | POA: Diagnosis not present

## 2021-11-28 ENCOUNTER — Telehealth: Payer: Self-pay | Admitting: *Deleted

## 2021-11-28 NOTE — Patient Outreach (Signed)
  Care Coordination   Initial Visit Note   11/28/2021 Name: Robert Cortez MRN: [id number] DOB: Mar 04, 1940  Robert Cortez is a 81 y.o. year old male who sees [name], MD for primary care. I spoke with  Robert Cortez by phone today.  What matters to the patients health and wellness today?  Member declines to participate.    SDOH assessments and interventions completed:  No     Care Coordination Interventions Activated:  No  Care Coordination Interventions:  No, not indicated   Follow up plan: No further intervention required.   Encounter Outcome:  Pt. Refused   Valente David, RN, MSN, [facility] Care Management Coordinator [phone]

## 2022-01-06 ENCOUNTER — Ambulatory Visit: Payer: Medicare PPO | Admitting: Podiatry

## 2022-01-12 ENCOUNTER — Emergency Department (HOSPITAL_COMMUNITY)
Admission: EM | Admit: 2022-01-12 | Discharge: 2022-01-12 | Discharge status: Against Medical Advice | Payer: Medicare PPO | Source: Home / Self Care

## 2022-01-12 ENCOUNTER — Other Ambulatory Visit: Payer: Self-pay

## 2022-01-12 ENCOUNTER — Observation Stay (HOSPITAL_COMMUNITY): Payer: Medicare PPO

## 2022-01-12 ENCOUNTER — Inpatient Hospital Stay (HOSPITAL_COMMUNITY)
Admission: EM | Admit: 2022-01-12 | Discharge: 2022-01-14 | DRG: 065 | Disposition: A | Discharge status: Home / Self Care | Payer: Medicare PPO | Attending: Internal Medicine | Admitting: Internal Medicine

## 2022-01-12 ENCOUNTER — Emergency Department (HOSPITAL_COMMUNITY): Payer: Medicare PPO

## 2022-01-12 ENCOUNTER — Encounter (HOSPITAL_COMMUNITY): Payer: Self-pay

## 2022-01-12 DIAGNOSIS — Z91013 Allergy to seafood: Secondary | ICD-10-CM

## 2022-01-12 DIAGNOSIS — I161 Hypertensive emergency: Secondary | ICD-10-CM | POA: Diagnosis present

## 2022-01-12 DIAGNOSIS — Z833 Family history of diabetes mellitus: Secondary | ICD-10-CM

## 2022-01-12 DIAGNOSIS — E1165 Type 2 diabetes mellitus with hyperglycemia: Secondary | ICD-10-CM | POA: Diagnosis present

## 2022-01-12 DIAGNOSIS — I4892 Unspecified atrial flutter: Secondary | ICD-10-CM | POA: Diagnosis present

## 2022-01-12 DIAGNOSIS — Z91030 Bee allergy status: Secondary | ICD-10-CM

## 2022-01-12 DIAGNOSIS — E876 Hypokalemia: Secondary | ICD-10-CM | POA: Diagnosis present

## 2022-01-12 DIAGNOSIS — Z043 Encounter for examination and observation following other accident: Secondary | ICD-10-CM | POA: Diagnosis not present

## 2022-01-12 DIAGNOSIS — I480 Paroxysmal atrial fibrillation: Secondary | ICD-10-CM | POA: Diagnosis present

## 2022-01-12 DIAGNOSIS — Z8673 Personal history of transient ischemic attack (TIA), and cerebral infarction without residual deficits: Secondary | ICD-10-CM

## 2022-01-12 DIAGNOSIS — R7981 Abnormal blood-gas level: Secondary | ICD-10-CM | POA: Diagnosis present

## 2022-01-12 DIAGNOSIS — Z5321 Procedure and treatment not carried out due to patient leaving prior to being seen by health care provider: Secondary | ICD-10-CM | POA: Insufficient documentation

## 2022-01-12 DIAGNOSIS — R824 Acetonuria: Secondary | ICD-10-CM | POA: Diagnosis present

## 2022-01-12 DIAGNOSIS — W19XXXA Unspecified fall, initial encounter: Secondary | ICD-10-CM | POA: Diagnosis present

## 2022-01-12 DIAGNOSIS — I63522 Cerebral infarction due to unspecified occlusion or stenosis of left anterior cerebral artery: Principal | ICD-10-CM | POA: Diagnosis present

## 2022-01-12 DIAGNOSIS — I639 Cerebral infarction, unspecified: Secondary | ICD-10-CM | POA: Diagnosis not present

## 2022-01-12 DIAGNOSIS — S81802A Unspecified open wound, left lower leg, initial encounter: Secondary | ICD-10-CM | POA: Diagnosis present

## 2022-01-12 DIAGNOSIS — Z85038 Personal history of other malignant neoplasm of large intestine: Secondary | ICD-10-CM | POA: Diagnosis not present

## 2022-01-12 DIAGNOSIS — R739 Hyperglycemia, unspecified: Secondary | ICD-10-CM | POA: Diagnosis not present

## 2022-01-12 DIAGNOSIS — R823 Hemoglobinuria: Secondary | ICD-10-CM | POA: Diagnosis present

## 2022-01-12 DIAGNOSIS — G8191 Hemiplegia, unspecified affecting right dominant side: Secondary | ICD-10-CM | POA: Diagnosis present

## 2022-01-12 DIAGNOSIS — R319 Hematuria, unspecified: Secondary | ICD-10-CM | POA: Diagnosis present

## 2022-01-12 DIAGNOSIS — N1831 Chronic kidney disease, stage 3a: Secondary | ICD-10-CM | POA: Diagnosis present

## 2022-01-12 DIAGNOSIS — Z8249 Family history of ischemic heart disease and other diseases of the circulatory system: Secondary | ICD-10-CM

## 2022-01-12 DIAGNOSIS — R81 Glycosuria: Secondary | ICD-10-CM | POA: Diagnosis present

## 2022-01-12 DIAGNOSIS — I10 Essential (primary) hypertension: Secondary | ICD-10-CM | POA: Diagnosis present

## 2022-01-12 DIAGNOSIS — R972 Elevated prostate specific antigen [PSA]: Secondary | ICD-10-CM | POA: Diagnosis present

## 2022-01-12 DIAGNOSIS — I4891 Unspecified atrial fibrillation: Secondary | ICD-10-CM | POA: Diagnosis not present

## 2022-01-12 DIAGNOSIS — R4781 Slurred speech: Secondary | ICD-10-CM | POA: Diagnosis present

## 2022-01-12 DIAGNOSIS — Z79899 Other long term (current) drug therapy: Secondary | ICD-10-CM

## 2022-01-12 DIAGNOSIS — R059 Cough, unspecified: Secondary | ICD-10-CM | POA: Diagnosis not present

## 2022-01-12 DIAGNOSIS — Y92009 Unspecified place in unspecified non-institutional (private) residence as the place of occurrence of the external cause: Secondary | ICD-10-CM | POA: Diagnosis not present

## 2022-01-12 DIAGNOSIS — E1122 Type 2 diabetes mellitus with diabetic chronic kidney disease: Secondary | ICD-10-CM | POA: Diagnosis present

## 2022-01-12 DIAGNOSIS — I3481 Nonrheumatic mitral (valve) annulus calcification: Secondary | ICD-10-CM | POA: Diagnosis present

## 2022-01-12 DIAGNOSIS — E782 Mixed hyperlipidemia: Secondary | ICD-10-CM | POA: Diagnosis not present

## 2022-01-12 DIAGNOSIS — R55 Syncope and collapse: Secondary | ICD-10-CM

## 2022-01-12 DIAGNOSIS — R29701 NIHSS score 1: Secondary | ICD-10-CM | POA: Diagnosis present

## 2022-01-12 DIAGNOSIS — R531 Weakness: Secondary | ICD-10-CM | POA: Diagnosis not present

## 2022-01-12 DIAGNOSIS — E785 Hyperlipidemia, unspecified: Secondary | ICD-10-CM | POA: Diagnosis present

## 2022-01-12 DIAGNOSIS — I4430 Unspecified atrioventricular block: Secondary | ICD-10-CM | POA: Diagnosis not present

## 2022-01-12 DIAGNOSIS — Z1152 Encounter for screening for COVID-19: Secondary | ICD-10-CM

## 2022-01-12 DIAGNOSIS — Z5329 Procedure and treatment not carried out because of patient's decision for other reasons: Secondary | ICD-10-CM | POA: Diagnosis present

## 2022-01-12 DIAGNOSIS — I130 Hypertensive heart and chronic kidney disease with heart failure and stage 1 through stage 4 chronic kidney disease, or unspecified chronic kidney disease: Secondary | ICD-10-CM | POA: Diagnosis present

## 2022-01-12 DIAGNOSIS — Z7982 Long term (current) use of aspirin: Secondary | ICD-10-CM

## 2022-01-12 DIAGNOSIS — M47812 Spondylosis without myelopathy or radiculopathy, cervical region: Secondary | ICD-10-CM | POA: Diagnosis not present

## 2022-01-12 HISTORY — DX: Hyperlipidemia, unspecified: E78.5

## 2022-01-12 HISTORY — DX: Malignant neoplasm of colon, unspecified: C18.9

## 2022-01-12 LAB — COMPREHENSIVE METABOLIC PANEL
ALT: 23 U/L (ref 0–44)
ALT: 24 U/L (ref 0–44)
AST: 28 U/L (ref 15–41)
AST: 27 U/L (ref 15–41)
Albumin: 4 g/dL (ref 3.5–5.0)
Albumin: 4.1 g/dL (ref 3.5–5.0)
Alkaline Phosphatase: 97 U/L (ref 38–126)
Alkaline Phosphatase: 103 U/L (ref 38–126)
Anion gap: 15 (ref 5–15)
Anion gap: 17 — ABNORMAL HIGH (ref 5–15)
BUN: 21 mg/dL (ref 8–23)
BUN: 23 mg/dL (ref 8–23)
CO2: 21 mmol/L — ABNORMAL LOW (ref 22–32)
CO2: 19 mmol/L — ABNORMAL LOW (ref 22–32)
Calcium: 9.5 mg/dL (ref 8.9–10.3)
Calcium: 9.6 mg/dL (ref 8.9–10.3)
Chloride: 100 mmol/L (ref 98–111)
Chloride: 102 mmol/L (ref 98–111)
Creatinine, Ser: 1.67 mg/dL — ABNORMAL HIGH (ref 0.61–1.24)
Creatinine, Ser: 1.52 mg/dL — ABNORMAL HIGH (ref 0.61–1.24)
GFR, Estimated: 41 mL/min — ABNORMAL LOW (ref >60)
GFR, Estimated: 46 mL/min — ABNORMAL LOW (ref >60)
Glucose, Bld: 124 mg/dL — ABNORMAL HIGH (ref 70–99)
Glucose, Bld: 211 mg/dL — ABNORMAL HIGH (ref 70–99)
Potassium: 2.9 mmol/L — ABNORMAL LOW (ref 3.5–5.1)
Potassium: 3.3 mmol/L — ABNORMAL LOW (ref 3.5–5.1)
Sodium: 136 mmol/L (ref 135–145)
Sodium: 138 mmol/L (ref 135–145)
Total Bilirubin: 0.9 mg/dL (ref 0.3–1.2)
Total Bilirubin: 0.9 mg/dL (ref 0.3–1.2)
Total Protein: 7.7 g/dL (ref 6.5–8.1)
Total Protein: 8.2 g/dL — ABNORMAL HIGH (ref 6.5–8.1)

## 2022-01-12 LAB — BLOOD GAS, VENOUS
Acid-Base Excess: 1.2 mmol/L (ref 0.0–2.0)
Bicarbonate: 26 mmol/L (ref 20.0–28.0)
O2 Saturation: 77.7 %
Patient temperature: 37
pCO2, Ven: 41 mmHg — ABNORMAL LOW (ref 44–60)
pH, Ven: 7.41 (ref 7.25–7.43)
pO2, Ven: 46 mmHg — ABNORMAL HIGH (ref 32–45)

## 2022-01-12 LAB — CBC
HCT: 43.2 % (ref 39.0–52.0)
Hemoglobin: 13.9 g/dL (ref 13.0–17.0)
MCH: 29.6 pg (ref 26.0–34.0)
MCHC: 32.2 g/dL (ref 30.0–36.0)
MCV: 91.9 fL (ref 80.0–100.0)
Platelets: 243 10*3/uL (ref 150–400)
RBC: 4.7 MIL/uL (ref 4.22–5.81)
RDW: 14.4 % (ref 11.5–15.5)
WBC: 9.6 10*3/uL (ref 4.0–10.5)
nRBC: 0 % (ref 0.0–0.2)

## 2022-01-12 LAB — CBC WITH DIFFERENTIAL/PLATELET
Abs Immature Granulocytes: 0.03 10*3/uL (ref 0.00–0.07)
Basophils Absolute: 0 10*3/uL (ref 0.0–0.1)
Basophils Relative: 0 %
Eosinophils Absolute: 0.2 10*3/uL (ref 0.0–0.5)
Eosinophils Relative: 2 %
HCT: 43.7 % (ref 39.0–52.0)
Hemoglobin: 14.4 g/dL (ref 13.0–17.0)
Immature Granulocytes: 0 %
Lymphocytes Relative: 24 %
Lymphs Abs: 2.3 10*3/uL (ref 0.7–4.0)
MCH: 30.3 pg (ref 26.0–34.0)
MCHC: 33 g/dL (ref 30.0–36.0)
MCV: 91.8 fL (ref 80.0–100.0)
Monocytes Absolute: 1 10*3/uL (ref 0.1–1.0)
Monocytes Relative: 10 %
Neutro Abs: 6.1 10*3/uL (ref 1.7–7.7)
Neutrophils Relative %: 64 %
Platelets: 221 10*3/uL (ref 150–400)
RBC: 4.76 MIL/uL (ref 4.22–5.81)
RDW: 14.5 % (ref 11.5–15.5)
WBC: 9.6 10*3/uL (ref 4.0–10.5)
nRBC: 0 % (ref 0.0–0.2)

## 2022-01-12 LAB — BASIC METABOLIC PANEL
Anion gap: 11 (ref 5–15)
BUN: 23 mg/dL (ref 8–23)
CO2: 22 mmol/L (ref 22–32)
Calcium: 9.4 mg/dL (ref 8.9–10.3)
Chloride: 102 mmol/L (ref 98–111)
Creatinine, Ser: 1.6 mg/dL — ABNORMAL HIGH (ref 0.61–1.24)
GFR, Estimated: 44 mL/min — ABNORMAL LOW (ref >60)
Glucose, Bld: 173 mg/dL — ABNORMAL HIGH (ref 70–99)
Potassium: 3.6 mmol/L (ref 3.5–5.1)
Sodium: 135 mmol/L (ref 135–145)

## 2022-01-12 LAB — RESP PANEL BY RT-PCR (RSV, FLU A&B, COVID)  RVPGX2
Influenza A by PCR: NEGATIVE
Influenza B by PCR: NEGATIVE
Resp Syncytial Virus by PCR: NEGATIVE
SARS Coronavirus 2 by RT PCR: NEGATIVE

## 2022-01-12 LAB — URINALYSIS, ROUTINE W REFLEX MICROSCOPIC
Bacteria, UA: NONE SEEN
Bilirubin Urine: NEGATIVE
Glucose, UA: >=500 mg/dL — AB
Ketones, ur: 5 mg/dL — AB
Leukocytes,Ua: NEGATIVE
Nitrite: NEGATIVE
Protein, ur: NEGATIVE mg/dL
Specific Gravity, Urine: 1.026 (ref 1.005–1.030)
pH: 5 (ref 5.0–8.0)

## 2022-01-12 LAB — TSH: TSH: 1.126 u[IU]/mL (ref 0.350–4.500)

## 2022-01-12 LAB — MAGNESIUM: Magnesium: 2.7 mg/dL — ABNORMAL HIGH (ref 1.7–2.4)

## 2022-01-12 LAB — TROPONIN I (HIGH SENSITIVITY): Troponin I (High Sensitivity): 48 ng/L — ABNORMAL HIGH (ref <18)

## 2022-01-12 LAB — T4, FREE: Free T4: 1.07 ng/dL (ref 0.61–1.12)

## 2022-01-12 MED ORDER — SODIUM CHLORIDE 0.9% FLUSH
3.0000 mL | Freq: Two times a day (BID) | INTRAVENOUS | Status: DC
  Administered 2022-01-13 – 2022-01-14 (×3): 3 mL via INTRAVENOUS

## 2022-01-12 MED ORDER — SIMVASTATIN 20 MG PO TABS
20.0000 mg | ORAL_TABLET | Freq: Every day | ORAL | Status: DC
  Administered 2022-01-13: 20 mg via ORAL
  Filled 2022-01-12: qty 1

## 2022-01-12 MED ORDER — INSULIN ASPART 100 UNIT/ML IJ SOLN
0.0000 [IU] | Freq: Three times a day (TID) | INTRAMUSCULAR | Status: DC
  Administered 2022-01-13: 2 [IU] via SUBCUTANEOUS
  Administered 2022-01-13: 3 [IU] via SUBCUTANEOUS
  Administered 2022-01-13: 2 [IU] via SUBCUTANEOUS
  Administered 2022-01-14: 3 [IU] via SUBCUTANEOUS
  Filled 2022-01-12: qty 0.15

## 2022-01-12 MED ORDER — ASPIRIN 300 MG RE SUPP: 300.0000 mg | Freq: Once | RECTAL | Status: AC

## 2022-01-12 MED ORDER — ONDANSETRON HCL 4 MG PO TABS: 4.0000 mg | ORAL_TABLET | Freq: Four times a day (QID) | ORAL | Status: DC | PRN

## 2022-01-12 MED ORDER — POTASSIUM CHLORIDE CRYS ER 20 MEQ PO TBCR
40.0000 meq | EXTENDED_RELEASE_TABLET | ORAL | Status: AC
  Administered 2022-01-12 (×2): 40 meq via ORAL
  Filled 2022-01-12 (×2): qty 2

## 2022-01-12 MED ORDER — ASPIRIN 81 MG PO CHEW: 81.0000 mg | CHEWABLE_TABLET | Freq: Every day | ORAL | Status: DC

## 2022-01-12 MED ORDER — ASPIRIN 300 MG RE SUPP: 300.0000 mg | Freq: Every day | RECTAL | Status: DC

## 2022-01-12 MED ORDER — ONDANSETRON HCL 4 MG/2ML IJ SOLN: 4.0000 mg | Freq: Four times a day (QID) | INTRAMUSCULAR | Status: DC | PRN

## 2022-01-12 MED ORDER — ASPIRIN 81 MG PO CHEW
81.0000 mg | CHEWABLE_TABLET | Freq: Once | ORAL | Status: AC
  Administered 2022-01-12: 81 mg via ORAL
  Filled 2022-01-12: qty 1

## 2022-01-12 MED ORDER — ACETAMINOPHEN 325 MG PO TABS: 650.0000 mg | ORAL_TABLET | Freq: Four times a day (QID) | ORAL | Status: DC | PRN

## 2022-01-12 MED ORDER — STROKE: EARLY STAGES OF RECOVERY BOOK
Freq: Once | Status: AC
  Filled 2022-01-12: qty 1

## 2022-01-12 MED ORDER — IOHEXOL 350 MG/ML SOLN
60.0000 mL | Freq: Once | INTRAVENOUS | Status: AC | PRN
  Administered 2022-01-12: 60 mL via INTRAVENOUS

## 2022-01-12 MED ORDER — INSULIN GLARGINE-YFGN 100 UNIT/ML ~~LOC~~ SOLN
32.0000 [IU] | Freq: Every day | SUBCUTANEOUS | Status: DC
  Administered 2022-01-13 – 2022-01-14 (×2): 32 [IU] via SUBCUTANEOUS
  Filled 2022-01-12 (×2): qty 0.32

## 2022-01-12 MED ORDER — MAGNESIUM SULFATE 2 GM/50ML IV SOLN
2.0000 g | Freq: Once | INTRAVENOUS | Status: AC
  Administered 2022-01-12: 2 g via INTRAVENOUS
  Filled 2022-01-12: qty 50

## 2022-01-12 MED ORDER — ASPIRIN 300 MG RE SUPP: 300.0000 mg | Freq: Once | RECTAL | Status: DC

## 2022-01-12 MED ORDER — ACETAMINOPHEN 650 MG RE SUPP: 650.0000 mg | Freq: Four times a day (QID) | RECTAL | Status: DC | PRN

## 2022-01-12 MED ORDER — INSULIN GLARGINE-LIXISENATIDE 100-33 UNT-MCG/ML ~~LOC~~ SOPN: 32.0000 [IU] | PEN_INJECTOR | Freq: Every morning | SUBCUTANEOUS | Status: DC

## 2022-01-12 NOTE — ED Triage Notes (Signed)
Per EMS- Patient reports that he had a fall yesterday and today. Patient went to [facility] ED yesterday and left AMA. Today, the patient states his right side gave out.   Patient denies hitting his head, having LOC, or on blood thinners.  Gait normal. speech normal.

## 2022-01-12 NOTE — ED Notes (Signed)
Pt is very upset he is not in a room yet.  He is calling his wife at this time.  HE wanted to go home however we discussed the situation and he decided that he is going to stay.

## 2022-01-12 NOTE — ED Notes (Signed)
Pt placed on monitor.  

## 2022-01-12 NOTE — Consult Note (Addendum)
Cardiology Consultation   Patient ID: Robert Cortez MRN: [id number]; DOB: 1940-04-08  Admit date: 01/12/2022 Date of Consult: 01/12/2022  PCP:  [name], MD   [place] Providers Cardiologist:  None        Patient Profile:   Robert Cortez is a 81 y.o. male with a hx of DM, HLD, HTN, elevated PSA, colon cancer, mini stroke 2007 with addition of ASA 325,  who is being seen 01/12/2022 for the evaluation of atrial fib after syncope and fall at the request of Dr. Olevia Bowens.  History of Present Illness:   Mr. Boakye with above hx and stress echo in 12/2011 with normal results and in 2007 at [facility] [place] around [place] to ER by EMS after standing and woke up sitting on floor with wife at his side.  His wife per EMS run sheet heard a thump and when she found her husband he had slurred speech.  When EMS arrived symptoms had resolved but he came to ER but left AMA.    This AM EMS called back pt was weak with ambulation and he felt greater on Rt side.  No slurring of words.    EKG:  The EKG was personally reviewed and demonstrates:  In ER initial EKG read as a fib but is actually SR. At  00:44  EKG on return to ER  A flutter. With RAD no acute ST changes  Telemetry:  Telemetry was personally reviewed and demonstrates:  regular while listening have ordered monitoring --is in SR now   PCXR with enlarged cardiac silhouette otherwise NAD. CT head no acute intracranial abnormality or acute traumatic injury identified.  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   CSpine without acute injury though with degeneration and with widespread facet arthropathy and mild spinal stenosis suspected at C6-C7.   Na 138 K+ 3.3 was 2.9 around [place]  BUN 23 Cr 1.52  WBC 9.6 Hgb 13.9 plts 243.  Neg flu, RSV or COVID  BP 183/72 to 146/55 P 68 R 14 afebrile No chest pain or SOB    Past Medical History:  Diagnosis Date   Colon  cancer ([place])    Diabetes mellitus without complication ([place])    HLD (hyperlipidemia)    Hypertension    Mini stroke 2007    Past Surgical History:  Procedure Laterality Date   COLON SURGERY     Precancerous lesion     Home Medications:  Prior to Admission medications   Medication Sig Start Date End Date Taking? Authorizing Provider  amLODipine (NORVASC) 10 MG tablet Take by mouth. 03/28/13   [provider]  aspirin 325 MG tablet Take by mouth.    [provider]  ciclopirox (PENLAC) 8 % solution Apply topically at bedtime. Apply over nail and surrounding skin. Apply daily over previous coat. After seven (7) days, may remove with alcohol and continue cycle. 09/30/21   Criselda Peaches, DPM  glimepiride (AMARYL) 4 MG tablet  06/13/13   [provider]  glucose blood (ACCU-CHEK COMFORT CURVE) test strip  06/04/10   [provider]  INVOKAMET 50-1000 MG TABS TAKE ONE TABLET BY MOUTH TWICE DAILY 12/29/13   Elayne Snare, MD  losartan (COZAAR) 100 MG tablet  08/24/13   [provider]  metoprolol succinate (TOPROL-XL) 50 MG 24 hr tablet  08/15/13   [provider]  mupirocin ointment (BACTROBAN) 2 % Apply 1 Application topically  daily. 09/30/21   Criselda Peaches, DPM  simvastatin (ZOCOR) 20 MG tablet  08/21/13   [provider]  [place] 100-33 UNT-MCG/ML SOPN SMARTSIG:32 SUB-Q Every Morning 08/18/21   [provider]  VICTOZA 18 MG/3ML SOPN 1.8 mg daily. 08/29/13   [provider]    Inpatient Medications: Scheduled Meds:  potassium chloride  40 mEq Oral Q4H   Continuous Infusions:  magnesium sulfate bolus IVPB 2 g (01/12/22 1300)   PRN Meds:   Allergies:    Allergies  Allergen Reactions   Bee Venom Anaphylaxis   Shellfish Allergy Anaphylaxis and Hives    Social History:   Social History   Socioeconomic History   Marital status: Married    Spouse name: Not on file   Number of children: Not on file   Years  of education: Not on file   Highest education level: Not on file  Occupational History   Not on file  Tobacco Use   Smoking status: Never   Smokeless tobacco: Never  Vaping Use   Vaping Use: Never used  Substance and Sexual Activity   Alcohol use: Yes   Drug use: Never   Sexual activity: Not Currently  Other Topics Concern   Not on file  Social History Narrative   Not on file   Social Determinants of Health   Financial Resource Strain: Not on file  Food Insecurity: Not on file  Transportation Needs: Not on file  Physical Activity: Not on file  Stress: Not on file  Social Connections: Not on file  Intimate Partner Violence: Not on file    Family History:    Family History  Problem Relation Age of Onset   Diabetes Mother    Hypertension Mother      ROS:  Please see the history of present illness.  General:no colds or fevers, no weight changes Skin:no rashes or ulcers HEENT:no blurred vision, no congestion CV:see HPI PUL:see HPI GI:no diarrhea constipation or melena, no indigestion GU:no hematuria, no dysuria MS:no joint pain, no claudication, abrasion of Lt leg hit leg or car door.  Neuro:possible syncope, no lightheadedness Endo:+ diabetes, no thyroid disease  All other ROS reviewed and negative.     Physical Exam/Data:   Vitals:   01/12/22 0800 01/12/22 0801 01/12/22 0952 01/12/22 1216  BP:  (!) 183/72 (!) 167/74 (!) 146/55  Pulse:  77 73 68  Resp:  '16 18 14  '$ Temp:  98.5 F (36.9 C)  98.5 F (36.9 C)  TempSrc:  Oral  Oral  SpO2:  100% 96% 98%  Weight: 86.2 kg     Height: 5' 9.5" (1.765 m)      No intake or output data in the 24 hours ending 01/12/22 1334    01/12/2022    8:00 AM 04/19/2019   11:42 AM 10/28/2016    9:40 AM  Last 3 Weights  Weight (lbs) 190 lb 187 lb 189 lb 3.2 oz  Weight (kg) 86.183 kg 84.823 kg 85.821 kg     Body mass index is 27.66 kg/m.  General:  Well nourished, well developed, in no acute distress HEENT: normal Neck:  no JVD Vascular: No carotid bruits; Distal pulses 2+ bilaterally Cardiac:  normal S1, S2; RRR; no murmur gallup rub or click Lungs:  clear to auscultation bilaterally, no wheezing, rhonchi or rales  Abd: soft, nontender, no hepatomegaly  Ext: no edema, abrasion of Left lower ext  has been using nu skin on it Musculoskeletal:  No deformities, BUE  and BLE strength normal and equal Skin: warm and dry  Neuro:  alert and oriented X 3 MAE follows commands, no focal abnormalities noted Psych:  Normal affect    Relevant CV Studies: Stress Echo 01/12/12  HISTORY:  Diabetes Hypertension     REASON: Assess LV function  INDICATION: Diabetes mellitus without mention of complications, type II or             unspecified type, not stated as uncontrolled ICD CODE: 250.00  STRESS ECHOCARDIOGRAPHY ------------------------------------------------------           Protocol: Treadmill ([place])             Drugs: None  Target Heart Rate: 136  Maximum Predicted Heart Rate: 160   TYPE     STAGE    TIME    HR    BP   DOSE       COMMENTS----- --------- --- ------- ---------- ------------------------------  Baseline                  95 158/ 77            RESTING LVEF >55%  Stress    1    180 sec. 136 205/ 72  0  Stress     2    180 sec. 148 227/ 86  0         HYPERTENSION  Stress     3     18 sec. 150   0/  0  0  Recovery   1      2 min. 117 213/ 68  0         PAC  Recovery   2      4 min. 109 165/ 71  0         PAC  Recovery   3      6 min. 101 144/ 78  0         PAC  Recovery   4      7 min.  97   0/  0  0   Stress Duration:   6.30 minutes.  Max Stress H.R: 150   WALL SEGMENT CHANGES ---------------------------------------------------------                   Rest            Stress                  --------------- ---------------  Anterior Septum: Normal          Hyperkinetic   Anterior Wall: Normal          Hyperkinetic    Lateral Wall: Normal          Hyperkinetic  Posterior Wall: Normal           Hyperkinetic   Inferior Wall: Normal          Hyperkinetic  Inferior Septum: Normal          Hyperkinetic            Apex: Normal          Hyperkinetic        MR: No MR           N/A             N/A             N/A  LVOT Vel:  LVOT Jet: N/A   ADDITIONAL FINDINGS ----------------------------------------------------------  Chest Discomfort: None         Arrhythmia: None  Termination Reason: Other                      Note: HYPERTENSION    Adverse Effects: None and None      Complications: None       ECG Results: Normal   INTERPRETATION ---------------------------------------------------------------    Interpretation: Normal Stress Echocardiogram.   Note: GRADE I DIASTOLIC DYSFUNCTION CORRELATES WITH E/A REVERSAL     Laboratory Data:  High Sensitivity Troponin:  No results for input(s): "TROPONINIHS" in the last 720 hours.   Chemistry Recent Labs  Lab 01/12/22 0050 01/12/22 0829  NA 136 138  K 2.9* 3.3*  CL 100 102  CO2 21* 19*  GLUCOSE 124* 211*  BUN 21 23  CREATININE 1.67* 1.52*  CALCIUM 9.5 9.6  GFRNONAA 41* 46*  ANIONGAP 15 17*    Recent Labs  Lab 01/12/22 0050 01/12/22 0829  PROT 7.7 8.2*  ALBUMIN 4.0 4.1  AST 28 27  ALT 23 24  ALKPHOS 97 103  BILITOT 0.9 0.9   Lipids No results for input(s): "CHOL", "TRIG", "HDL", "LABVLDL", "LDLCALC", "CHOLHDL" in the last 168 hours.  Hematology Recent Labs  Lab 01/12/22 0050 01/12/22 0829  WBC 9.6 9.6  RBC 4.76 4.70  HGB 14.4 13.9  HCT 43.7 43.2  MCV 91.8 91.9  MCH 30.3 29.6  MCHC 33.0 32.2  RDW 14.5 14.4  PLT 221 243   Thyroid No results for input(s): "TSH", "FREET4" in the last 168 hours.  BNPNo results for input(s): "BNP", "PROBNP" in the last 168 hours.  DDimer No results for input(s): "DDIMER" in the last 168 hours.   Radiology/Studies:  CT Cervical Spine Wo Contrast  Result Date: 01/12/2022 CLINICAL DATA:  81 year old male status post fall yesterday. EXAM: CT CERVICAL SPINE WITHOUT CONTRAST  TECHNIQUE: Multidetector CT imaging of the cervical spine was performed without intravenous contrast. Multiplanar CT image reconstructions were also generated. RADIATION DOSE REDUCTION: This exam was performed according to the departmental dose-optimization program which includes automated exposure control, adjustment of the mA and/or kV according to patient size and/or use of iterative reconstruction technique. COMPARISON:  Head CT today. FINDINGS: Alignment: Straightening of cervical lordosis. Cervicothoracic junction alignment is within normal limits. Bilateral posterior element alignment is within normal limits. Skull base and vertebrae: Bone mineralization is within normal limits. Visualized skull base is intact. No atlanto-occipital dissociation. C1 and C2 appear intact and aligned. No acute osseous abnormality identified. Soft tissues and spinal canal: No prevertebral fluid or swelling. No visible canal hematoma. Negative visible noncontrast neck soft tissues. Disc levels: Mild degenerative appearing anterolisthesis of C3 on C4. Bilateral facet arthropathy at that level, also widespread throughout the left cervical spine. Advanced disc and endplate degeneration maximal at C6-C7. Evidence of mild spinal stenosis there. Upper chest: Negative. IMPRESSION: 1. No acute traumatic injury identified in the cervical spine. 2. 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 Electronically Signed   By: Genevie Ann M.D.   On: 01/12/2022 10:03   CT Head Wo Contrast  Result Date: 01/12/2022 CLINICAL DATA:  81 year old male status post fall yesterday. EXAM: CT HEAD WITHOUT CONTRAST TECHNIQUE: Contiguous axial images were obtained from the base of the skull through the vertex without intravenous contrast. RADIATION DOSE REDUCTION: This exam was performed according to the departmental dose-optimization program which includes automated exposure control, adjustment of  the mA and/or  kV according to patient size and/or use of iterative reconstruction technique. COMPARISON:  Head CT 06/29/2005.  Brain MRI 06/29/2005. 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 Similar chronic appearing encephalomalacia in the right MCA territory is new since 2007, right middle frontal gyrus posteriorly. And similar chronic appearing encephalomalacia in the left PCA territory, occipital pole is also new. No midline shift, ventriculomegaly, mass effect, evidence of mass lesion, intracranial hemorrhage or evidence of cortically based acute infarction. Vascular: Calcified atherosclerosis at the skull base. No suspicious intracranial vascular hyperdensity. Skull: No acute osseous abnormality identified. Sinuses/Orbits: Visualized paranasal sinuses and mastoids are clear. Other: No orbit or scalp soft tissue injury identified. IMPRESSION: 1. No acute intracranial abnormality or acute traumatic injury identified. 2. 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 Electronically Signed   By: Genevie Ann M.D.   On: 01/12/2022 10:00   DG Chest Port 1 View  Result Date: 01/12/2022 CLINICAL DATA:  Cough EXAM: PORTABLE CHEST 1 VIEW COMPARISON:  09/14/2005 FINDINGS: Cardiac silhouette appears prominent. No pneumonia or pulmonary edema. No pneumothorax or pleural effusion. IMPRESSION: Enlarged cardiac silhouette. Electronically Signed   By: Sammie Bench M.D.   On: 01/12/2022 08:40     Assessment and Plan:   Possible [place] around [place] with possible syncope but slurred speech per his wife and symptoms resolved with EMS arrival. EKG with SR at that time, CT head no bleed or acute issues.  Consider neuro consult and MRI with hx of mini stroke in past.  Atrial flutter  CHA2DS2VASc of 6 with hx of "ministroke" has been on ASA 325 mg since.  Is on toprol XL 50 mg  would stop ASA and  add heparin vs eliquis  cardiomegaly on PCXR will check Echo with a flutter and cardiomegaly may have been going in and out at home.  May have had conversion syncope. Back in SR.  HTN on BB losartan 100, and HCTZ 50 and amlodipine 10 mg HLD on zocor  labs 09/08/21 with T chol 126, TG 74 LDL 54 continue statin Hypokalemia replacing,   rec'd 2 GM of Mg+ as well. DM-2 per IM  Hematuria he is not aware of blood - microscopic.  May need GU eval defer to IM   Risk Assessment/Risk Scores:          CHA2DS2-VASc Score = 6   This indicates a 9.7% annual risk of stroke. The patient's score is based upon: CHF History: 0 HTN History: 1 Diabetes History: 1 Stroke History: 2 Vascular Disease History: 0 Age Score: 2 Gender Score: 0         For questions or updates, please contact [place] Please consult [email or web address] for contact info under    Signed, Cecilie Kicks, NP  01/12/2022 1:34 PM  Patient seen and examined and agree with Cecilie Kicks, NP as detailed above.  In brief, the patient is a 81 year old male with history of DMII, HTN, HLD, prior TIA in 2007 who presented to the ER with syncope found to be in new aflutter for which Cardiology was consulted.   Patient initially presented to [facility] after the episode of syncope and slurred speech but left AMA prior to evaluation. Returned to the ER later today after he had another fall with right sided weakness (no syncope at that time).  In the ER, ECG at 11:55 with Aflutter with HR 70s. Labs notable  for K 2.9, Cr 1.67, trop 48. CT head with advanced chronic ischemic disease but otherwise no acute intracranial abnormality. MRI brain pending.   Unclear etiology of syncope but rapid LOC without prodrome concerning for cardiac etiology. Given slurred speech and right sided weakness, there is also concern for acute stroke. MRI pending. Will check TTE and continue home metoprolol. Will plan to start apixaban vs heparin pending MRI head.   Continue telemetry as there is also a possibility for a conversion pause that led to syncope given new diagnosis of Aflutter. If tele unrevealing, can plan for cardiac monitor on discharge as well.   GEN: No acute distress.   Neck: No JVD Cardiac: RRR, no murmurs, rubs, or gallops.  Respiratory: Clear to auscultation bilaterally. GI: Soft, nontender, non-distended  MS: No edema; No deformity. Neuro:  Nonfocal  Psych: Normal affect    Plan: -Plan to start apixaban vs heparin pending MRI head findings -Check TTE -Continue tele; can consider cardiac monitor on discharge if tele/work-up of syncope unrevealing -Continue home metop '50mg'$  XL daily -Continue home statin  Gwyndolyn Kaufman, MD

## 2022-01-12 NOTE — ED Notes (Signed)
Pt tx to MRI

## 2022-01-12 NOTE — Consult Note (Cosign Needed Addendum)
NEUROLOGY CONSULT  Reason for Consult:syncope Referring Physician: Dr Olevia Bowens  CC: fall/syncope/Rt side weakness  HPI: Robert Cortez is an 81 y.o. male medical history significant of colon cancer, ileus following gastrointestinal surgery, hypocalcemia, type 2 diabetes, hyperlipidemia, hypertension, "TIA's", ED, elevated PSA. Pt reports syncope and right side weakness with falls over last two days PTA. The patient stated that he did not lose consciousness.  However, he cannot recall all the events.  He denied any injuries.  He his wife reported that he was speaking with slurred speech after the fall. EMS was called and work up was started at [facility] ED, but unfortunately, he left AMA before being seen.  After that, he presented later same morning to [facility]. He was found to be in Afib. 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    Past Medical History Past Medical History:  Diagnosis Date   Colon cancer ([place])    Diabetes mellitus without complication ([place])    HLD (hyperlipidemia)    Hypertension    Mini stroke 2007    Past Surgical History Past Surgical History:  Procedure Laterality Date   COLON SURGERY     Precancerous lesion    Family History Family History  Problem Relation Age of Onset   Diabetes Mother    Hypertension Mother     Social History    reports that he has never smoked. He has never used smokeless tobacco. He reports current alcohol use. He reports that he does not use drugs.  Allergies Allergies  Allergen Reactions   Bee Venom Anaphylaxis   Shellfish Allergy Anaphylaxis and Hives    Home Medications (Not in a hospital admission)   Hospital Medications  [START ON 01/13/2022]  stroke: early stages of recovery book   Does not apply Once   [START ON 01/13/2022] Insulin Glargine-Lixisenatide  32 Units Subcutaneous q AM   simvastatin  20 mg Oral QHS    sodium chloride flush  3 mL Intravenous Q12H     ROS: History obtained from the patient    General ROS: negative for - chills, fatigue, fever, night sweats, weight gain or weight loss Psychological ROS: negative for - behavioral disorder, hallucinations, memory difficulties, mood swings or suicidal ideation Ophthalmic ROS: negative for - blurry vision, double vision, eye pain or loss of vision ENT ROS: negative for - epistaxis, nasal discharge, oral lesions, sore throat, tinnitus or vertigo Allergy and Immunology ROS: negative for - hives or itchy/watery eyes Hematological and Lymphatic ROS: negative for - bleeding problems, bruising or swollen lymph nodes Endocrine ROS: negative for - galactorrhea, hair pattern changes, polydipsia/polyuria or temperature intolerance Respiratory ROS: negative for - cough, hemoptysis, shortness of breath or wheezing Cardiovascular ROS: negative for - chest pain, dyspnea on exertion, edema or irregular heartbeat Gastrointestinal ROS: negative for - abdominal pain, diarrhea, hematemesis, nausea/vomiting or stool incontinence Genito-Urinary ROS: negative for - dysuria, hematuria, incontinence or urinary frequency/urgency Musculoskeletal ROS: negative for - joint swelling or muscular weakness Neurological ROS: as noted in HPI Dermatological ROS: negative for rash and skin lesion changes   Physical Examination:  Vitals:   01/12/22 0801 01/12/22 0952 01/12/22 1216 01/12/22 1519  BP: (!) 183/72 (!) 167/74 (!) 146/55 (!) 136/99  Pulse: 77 73 68 66  Resp: '16 18 14 18  '$ Temp: 98.5 F (36.9 C)  98.5 F (36.9 C) 98.5 F (36.9 C)  TempSrc: Oral  Oral  Oral  SpO2: 100% 96% 98% 96%  Weight:      Height:        Physical Examination:  General: Appears well-developed; elderly black man in no acute distress. Psych: Affect appropriate to situation Eyes: No scleral injection HENT: No OP obstrucion Head: Normocephalic.  Cardiovascular: Irregular, normal  rate Respiratory: Effort normal and breath sounds normal to anterior ascultation GI: Soft.  No distension. There is no tenderness.  Skin: WDI, open, draining, unhealing wounds on legs    Neurological Examination Mental Status: Alert, oriented, thought content appropriate.  Speech fluent without evidence of aphasia. Able to follow 3 step commands without difficulty. Cranial Nerves: II: Visual fields grossly normal,  III,IV, VI: ptosis not present, extra-ocular motions intact bilaterally, pupils equal, round, reactive to light and accommodation V,VII: smile symmetric, facial light touch sensation normal bilaterally VIII: hearing normal bilaterally IX,X: uvula rises symmetrically XI: bilateral shoulder shrug XII: midline tongue extension Motor: Tone and bulk:normal tone throughout; no atrophy noted. Left side wnl 5/5. Right arm 5/5 no drift, but right leg with drift, slightly weaker foot push.  Sensory: light touch intact throughout, bilaterally, no extinction to bilateral stimuli Deep Tendon Reflexes: muted throughout.  Cerebellar: normal finger-to-nose, normal rapid alternating movements. Unable to complete heel-to-shin test d/t skin breakdown on shins.  Gait: normal gait and station  NIHSS 1a Level of Conscious.:  1b LOC Questions:  1c LOC Commands:  2 Best Gaze:  3 Visual:  4 Facial Palsy:  5a Motor Arm - Left: 5b Motor Arm - Right:  6a Motor Leg - Left:  6b Motor Leg - Right: 1 7 Limb Ataxia:  8 Sensory:  9 Best Language:  10 Dysarthria:  11 Extinct. and Inatten.:  TOTAL: 1 Baseline mRS:1   LABORATORY STUDIES:  Basic Metabolic Panel: Recent Labs  Lab 01/12/22 0050 01/12/22 0829 01/12/22 1334 01/12/22 1351  NA 136 138 135  --   K 2.9* 3.3* 3.6  --   CL 100 102 102  --   CO2 21* 19* 22  --   GLUCOSE 124* 211* 173*  --   BUN '21 23 23  '$ --   CREATININE 1.67* 1.52* 1.60*  --   CALCIUM 9.5 9.6 9.4  --   MG  --   --   --  2.7*    Liver Function Tests: Recent  Labs  Lab 01/12/22 0050 01/12/22 0829  AST 28 27  ALT 23 24  ALKPHOS 97 103  BILITOT 0.9 0.9  PROT 7.7 8.2*  ALBUMIN 4.0 4.1   No results for input(s): "LIPASE", "AMYLASE" in the last 168 hours. No results for input(s): "AMMONIA" in the last 168 hours.  CBC: Recent Labs  Lab 01/12/22 0050 01/12/22 0829  WBC 9.6 9.6  NEUTROABS 6.1  --   HGB 14.4 13.9  HCT 43.7 43.2  MCV 91.8 91.9  PLT 221 243    Cardiac Enzymes: No results for input(s): "CKTOTAL", "CKMB", "CKMBINDEX", "TROPONINI" in the last 168 hours.  BNP: Invalid input(s): "POCBNP"  CBG: No results for input(s): "GLUCAP" in the last 168 hours.  Microbiology:   Coagulation Studies: No results for input(s): "LABPROT", "INR" in the last 72 hours.  Urinalysis:  Recent Labs  Lab 01/12/22 1217  COLORURINE YELLOW  LABSPEC 1.026  PHURINE 5.0  GLUCOSEU >=500*  HGBUR MODERATE*  BILIRUBINUR NEGATIVE  KETONESUR 5*  PROTEINUR NEGATIVE  NITRITE NEGATIVE  LEUKOCYTESUR NEGATIVE    Lipid Panel:  No results found for: "CHOL", "TRIG", "HDL", "CHOLHDL", "  VLDL", "LDLCALC"  HgbA1C:  Lab Results  Component Value Date   HGBA1C 11.1 (A) 04/19/2019    EKG EKG  IMAGING: MR BRAIN WO CONTRAST  Result Date: 01/12/2022 CLINICAL DATA:  Syncope/presyncope.  History of colon cancer. EXAM: MRI HEAD WITHOUT CONTRAST TECHNIQUE: Multiplanar, multiecho pulse sequences of the brain and surrounding structures were obtained without intravenous contrast. COMPARISON:  Head CT same day FINDINGS: Brain: Diffusion imaging shows restricted diffusion in the left cingulate gyrus and corpus callosum consistent with infarction in the anterior cerebral artery territory. The area of infarction measures about 2 cm in size. No evidence of blood products in association. Extensive old infarction of the inferior cerebellum on the left. Chronic small-vessel ischemic changes of the pons. Old left occipital cortical infarction. Old cortical and  subcortical infarction in the right parietal lobe. Few old small vessel insults elsewhere within the hemispheric white matter. Old small cortical infarction in the right posterior frontal region. No hydrocephalus. No extra-axial collection. Vascular: Abnormal flow pattern in the left vertebral artery. Other major vessels show flow. Skull and upper cervical spine: Normal Sinuses/Orbits: Clear/normal Other: None IMPRESSION: 1. Acute infarction in the left cingulate gyrus and corpus callosum consistent with infarction in the anterior cerebral artery territory. No evidence of hemorrhage. Area of infarction measures about 2 cm. 2. Extensive old infarction of the inferior cerebellum on the left. 3. Old left occipital cortical infarction. Old right parietal cortical and subcortical infarction. Small old right posterior frontal cortical infarction. 4. Abnormal flow pattern in the left vertebral artery, chronic. Electronically Signed   By: Nelson Chimes M.D.   On: 01/12/2022 15:38   CT Cervical Spine Wo Contrast  Result Date: 01/12/2022 CLINICAL DATA:  81 year old male status post fall yesterday. EXAM: CT CERVICAL SPINE WITHOUT CONTRAST TECHNIQUE: Multidetector CT imaging of the cervical spine was performed without intravenous contrast. Multiplanar CT image reconstructions were also generated. RADIATION DOSE REDUCTION: This exam was performed according to the departmental dose-optimization program which includes automated exposure control, adjustment of the mA and/or kV according to patient size and/or use of iterative reconstruction technique. COMPARISON:  Head CT today. FINDINGS: Alignment: Straightening of cervical lordosis. Cervicothoracic junction alignment is within normal limits. Bilateral posterior element alignment is within normal limits. Skull base and vertebrae: Bone mineralization is within normal limits. Visualized skull base is intact. No atlanto-occipital dissociation. C1 and C2 appear intact and aligned.  No acute osseous abnormality identified. Soft tissues and spinal canal: No prevertebral fluid or swelling. No visible canal hematoma. Negative visible noncontrast neck soft tissues. Disc levels: Mild degenerative appearing anterolisthesis of C3 on C4. Bilateral facet arthropathy at that level, also widespread throughout the left cervical spine. Advanced disc and endplate degeneration maximal at C6-C7. Evidence of mild spinal stenosis there. Upper chest: Negative. IMPRESSION: 1. No acute traumatic injury identified in the cervical spine. 2. 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 Electronically Signed   By: Genevie Ann M.D.   On: 01/12/2022 10:03   CT Head Wo Contrast  Result Date: 01/12/2022 CLINICAL DATA:  81 year old male status post fall yesterday. EXAM: CT HEAD WITHOUT CONTRAST TECHNIQUE: Contiguous axial images were obtained from the base of the skull through the vertex without intravenous contrast. RADIATION DOSE REDUCTION: This exam was performed according to the departmental dose-optimization program which includes automated exposure control, adjustment of the mA and/or kV according to patient size and/or use of iterative reconstruction technique. COMPARISON:  Head CT 06/29/2005.  Brain MRI  06/29/2005. 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 Similar chronic appearing encephalomalacia in the right MCA territory is new since 2007, right middle frontal gyrus posteriorly. And similar chronic appearing encephalomalacia in the left PCA territory, occipital pole is also new. No midline shift, ventriculomegaly, mass effect, evidence of mass lesion, intracranial hemorrhage or evidence of cortically based acute infarction. Vascular: Calcified atherosclerosis at the skull base. No suspicious intracranial vascular hyperdensity. Skull: No acute osseous abnormality identified.  Sinuses/Orbits: Visualized paranasal sinuses and mastoids are clear. Other: No orbit or scalp soft tissue injury identified. IMPRESSION: 1. No acute intracranial abnormality or acute traumatic injury identified. 2. 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 Electronically Signed   By: Genevie Ann M.D.   On: 01/12/2022 10:00   DG Chest Port 1 View  Result Date: 01/12/2022 CLINICAL DATA:  Cough EXAM: PORTABLE CHEST 1 VIEW COMPARISON:  09/14/2005 FINDINGS: Cardiac silhouette appears prominent. No pneumonia or pulmonary edema. No pneumothorax or pleural effusion. IMPRESSION: Enlarged cardiac silhouette. Electronically Signed   By: Sammie Bench M.D.   On: 01/12/2022 08:40     Assessment/Plan: 81yrold man with PMH colon cancer, ileus following gastrointestinal surgery, hypocalcemia, type 2 diabetes, hyperlipidemia, hypertension, "TIA's" although suspect these were probably strokes as pt refused medical work up for these. He presented to ER x 2 today d/t right side weakness and falls. MRI brain shows new infarct in Lt cingulate gyrus/corpus callosum. Numerous old strokes both cortical/subcortical noted as well.   # Acute Ischemic Stroke. Etiology is cardioembolic d/t Arfib, not on [place]# Rt side weakness: d/t above # HTN # New Afib # DM2- A1C prev 11.1, current A1c added # h/o "TIA" and pt reports no residual deficits, however there are multiple old cortical/subcortical infarcts seen on MRI.   PLAN/RECS: CTA h/n  Transthoracic Echo Eliquis PO for Afib when route avail Goal A1C <7 Start Atorvastatin 80 mg/other high intensity statin for LDL goal <70 when route avail BP goal: permissive HTN upto 220/120 mmHg ( 185/110 if patient has CHF, CKD) HBAIC and Lipid profile Telemetry monitoring Frequent neuro checks NPO until passes stroke swallow screen  Attending neurologist's note to follow DPort [place] PhDc, ARNP-C, ANVP-BC Please  contact via [place]anePerformed a face to face diagnostic evaluation.   I have reviewed the contents of history and physical exam as documented by PA/ARNP/Resident and agree with above documentation.  I have discussed and formulated the above plan as documented. Edits to the note have been made as needed.  Impression/Key exam findings/Plan: 37M p/w syncope and right sided weakness and falls. MRI demonstrated multiple old strokes along with new L ACA infarct. Also diagnosed with new onset Afibb and cardiology team is evaluating for possible cardiac etiologies of syncope and aflutter.  Stroke workup with CTA with multifocal multivessel stenosis, no LVO. LDL 65, TTE pending.  Suspected etiology of stroke is cardioembolic in the setting of new onset Afibb but does endorse poorly controlled DM2, HTN, and takes Simvastatin for HLD with last LDL of 65.  I ordered eliquis '5mg'$  BID. He should follow up with Dr. [facility] who he saw about 14 years ago for TIA.  We will signoff. Please feel free to contact [place] any questions or concerns.  SDonnetta Simpers MD [facility] [phone]  If 7pm to 7am, please call on call as listed  on AMION.

## 2022-01-12 NOTE — ED Notes (Signed)
Pt left shin wrapped and bandaged, provider aware of wound.

## 2022-01-12 NOTE — ED Triage Notes (Signed)
Patient arrived with EMS from home , lost his balance and fell this evening with brief syncope , denies injury , patient can not recall incident , denies injury or pain , alert and oriented at arrival /respirations unlabored.

## 2022-01-12 NOTE — Progress Notes (Addendum)
[place] admitting physician addendum:  MRI of brain without contrast. IMPRESSION: 1. Acute infarction in the left cingulate gyrus and corpus callosum consistent with infarction in the anterior cerebral artery territory. No evidence of hemorrhage. Area of infarction measures about 2 cm. 2. Extensive old infarction of the inferior cerebellum on the left. 3. Old left occipital cortical infarction. Old right parietal cortical and subcortical infarction. Small old right posterior frontal cortical infarction. 4. Abnormal flow pattern in the left vertebral artery, chronic.   Electronically Signed   By: Nelson Chimes M.D.   On: 01/12/2022 15:38  The neurology team is aware.  They will evaluate him here as there are no available beds at [facility].  Acute CVA order set placed.  He will remain here for the rest of the workup.  I have discussed the finding with the patient and his wife.  I have told him that we are holding his blood pressure medications as this procedure is standard to allow permissive hypertension.  They voiced understanding.  Tennis Must, MD.

## 2022-01-12 NOTE — H&P (Signed)
History and Physical    Patient: Robert Cortez:[id number] DOB: [birth year] DOA: 01/12/2022 DOS: the patient was seen and examined on 01/12/2022 PCP: [name], MD  Patient coming from: Home  Chief Complaint:  Chief Complaint  Patient presents with   Fall   HPI: DOMNICK Cortez is a 81 y.o. male with medical history significant of colon cancer, ileus following gastrointestinal surgery, hypocalcemia, type 2 diabetes, hyperlipidemia, hypertension, TIA, ED, elevated PSA who lost his balance, fell at home due to brief near syncope yesterday evening.  The patient stated that he did not lose consciousness.  However, he cannot recall all the events.  He denied any injuries.  He his wife stated that he was speaking with slurred speech after the fall.  He went to [facility] ED, but subsequently left AMA before being seen.  After that, he presented later this morning to [facility].  He denied headache, blurred vision, focal weakness or numbness. He denied fever, chills, rhinorrhea, sore throat, wheezing or hemoptysis.  No chest pain, palpitations, diaphoresis, PND, orthopnea, but gets occasional pitting edema of the lower extremities.  No abdominal pain, nausea, emesis, diarrhea, constipation, melena or hematochezia.  No flank pain, dysuria, frequency or hematuria.  No polyuria, polydipsia, polyphagia or blurred vision.   ED course: Initial vital signs were temperature 98.5 F, pulse 77, respirations 16, BP 183/72 mmHg O2 sat 96% on room air.  I order KCl 40 mEq every 4 hours x 2 and 2 g of magnesium sulfate IVPB.  I also consulted cardiology for evaluation.  Lab work: His urinalysis showed glucosuria more than 500 and ketonuria 5 mg deciliter.  There was moderate hemoglobinuria.  Microscopic examination was unremarkable.  Coronavirus, RSV and influenza PCR were negative.  CBC was normal with a white count of 9.6, hemoglobin 13.9 g/dL platelets 243.  CMP showed a potassium  of 3.3 and CO2 of 19 mmol/L with an anion gap of 17, the rest of the electrolytes were normal.  Glucose 211, creatinine 1.52 mg/dL.  Total protein was 8.2 g/dL.  Imaging: Portable chest radiograph showed cardiomegaly, but no other abnormality.  CT head without contrast with no acute intracranial abnormality or acute traumatic injury.  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     Review of Systems: As mentioned in the history of present illness. All other systems reviewed and are negative. Past Medical History:  Diagnosis Date   Colon cancer ([place])    Diabetes mellitus without complication ([place])    HLD (hyperlipidemia)    Hypertension    Mini stroke 2007   Past Surgical History:  Procedure Laterality Date   COLON SURGERY     Precancerous lesion   Social History:  reports that he has never smoked. He has never used smokeless tobacco. He reports current alcohol use. He reports that he does not use drugs.  Allergies  Allergen Reactions   Bee Venom Anaphylaxis   Shellfish Allergy Anaphylaxis and Hives    Family History  Problem Relation Age of Onset   Diabetes Mother    Hypertension Mother     Prior to Admission medications   Medication Sig Start Date End Date Taking? Authorizing Provider  hydrochlorothiazide (HYDRODIURIL) 50 MG tablet Take 50 mg by mouth daily.  11/03/21  Yes [provider]  amLODipine (NORVASC) 10 MG tablet Take by mouth. 03/28/13   [provider]  aspirin 325 MG tablet Take by mouth.    [provider]  ciclopirox (PENLAC) 8 % solution Apply topically at bedtime. Apply over nail and surrounding skin. Apply daily over previous coat. After seven (7) days, may remove with alcohol and continue cycle. 09/30/21   Criselda Peaches, DPM  glimepiride (AMARYL) 4 MG tablet  06/13/13   [provider]  glucose blood (ACCU-CHEK COMFORT CURVE) test strip  06/04/10   [provider]  INVOKAMET 50-1000 MG TABS TAKE ONE TABLET BY MOUTH TWICE DAILY 12/29/13   Elayne Snare, MD  losartan (COZAAR) 100 MG tablet  08/24/13   [provider]  metoprolol succinate (TOPROL-XL) 50 MG 24 hr tablet  08/15/13   [provider]  mupirocin ointment (BACTROBAN) 2 % Apply 1 Application topically daily. 09/30/21   Criselda Peaches, DPM  simvastatin (ZOCOR) 20 MG tablet  08/21/13   [provider]  [place] 100-33 UNT-MCG/ML SOPN SMARTSIG:32 SUB-Q Every Morning 08/18/21   [provider]  VICTOZA 18 MG/3ML SOPN 1.8 mg daily. 08/29/13   [provider]    Physical Exam: Vitals:   01/12/22 0800 01/12/22 0801 01/12/22 0952 01/12/22 1216  BP:  (!) 183/72 (!) 167/74 (!) 146/55  Pulse:  77 73 68  Resp:  '16 18 14  '$ Temp:  98.5 F (36.9 C)  98.5 F (36.9 C)  TempSrc:  Oral  Oral  SpO2:  100% 96% 98%  Weight: 86.2 kg     Height: 5' 9.5" (1.765 m)      Physical Exam Vitals and nursing note reviewed.  Constitutional:      General: He is awake. He is not in acute distress.    Appearance: Normal appearance.  HENT:     Head: Normocephalic.     Nose: No rhinorrhea.     Mouth/Throat:     Mouth: Mucous membranes are moist.  Eyes:     General: No scleral icterus.    Pupils: Pupils are equal, round, and reactive to light.  Neck:     Vascular: No carotid bruit or JVD.  Cardiovascular:     Rate and Rhythm: Normal rate. Rhythm irregular.     Heart sounds: S1 normal and S2 normal.  Pulmonary:     Effort: Pulmonary effort is normal.     Breath sounds: No wheezing, rhonchi or rales.  Abdominal:     General: Abdomen is protuberant. Bowel sounds are normal. There is no distension.     Palpations: Abdomen is soft.     Tenderness: There is no abdominal tenderness. There is no guarding or rebound.   Musculoskeletal:     Cervical back: Neck supple. No spasms or tenderness.     Thoracic back: No spasms or tenderness.     Lumbar back: No spasms or tenderness.     Right lower leg: 2+ Edema present.     Left lower leg: 2+ Edema present.  Skin:    General: Skin is warm and dry.     Findings: Wound present.     Comments: Left mid lower pretibial area shows of 5 x 4 cm wound with no discharge, mild surrounding edema and erythema.  There is no calor or TTP.  There are 2 other small 1 x 1 cm superomedial and inferior lateral satellite wounds.  Please see pictures below.  Neurological:  General: No focal deficit present.     Mental Status: He is alert and oriented to person, place, and time.  Psychiatric:        Mood and Affect: Mood normal.        Behavior: Behavior normal. Behavior is cooperative.     Data Reviewed:  Results are pending, will review when available.  Assessment and Plan: Principal Problem:   Syncope and collapse Observation/telemetry. Correct electrolyte abnormality. Check carotid Doppler. Check echocardiogram. Check brain MRI. Cardiology consult appreciated. Neurology consult suggested by cardiology. Had reached out to neurology on-call, awaiting response.  Active Problems:   New onset atrial flutter (HCC) CHA2DS2-VASc Score = 6   This indicates a 9.7% annual risk of stroke. The patient's score is based upon: CHF History: 0 HTN History: 1 Diabetes History: 1 Stroke History: 2 Vascular Disease History: 0 Age Score: 2 Gender Score: 0 Hold aspirin. Heparin versus apixaban. Begin anticoagulation after MRI results. Cardiology consult appreciated.    Essential hypertension, benign Hold antihypertensives until MRI done. If no signs of acute CVA. Resume home regimen. Would decrease HCTZ to 25 mg from 50.    Type 2 diabetes mellitus with hyperglycemia (HCC) Carbohydrate modified diet. Continue ongoing Vokanamet 150/1000 mg p.o. twice daily. CBG  monitoring before meals and bedtime. RI SS before meals.    Hyperlipidemia Continue simvastatin 20 mg p.o. daily.    Hypokalemia Correcting. Follow-up potassium level.    Hypocarbia With increased anion gap. Repeat chemistry with normal bicarb and anion gap. Venous blood gas showed normal pH.    Stage 3a chronic kidney disease (CKD) ([place]) Renal function seems stable. Monitor GFR and electrolytes.    Hemoglobinuria Recheck urinalysis as an outpatient. Follow-up with nephrology and/or PCP.       Advance Care Planning:   Code Status: Not on file   Consults: Cardiology Gwyndolyn Kaufman MD).  Family Communication: His wife was at bedside.  Severity of Illness: The appropriate patient status for this patient is OBSERVATION. Observation status is judged to be reasonable and necessary in order to provide the required intensity of service to ensure the patient's safety. The patient's presenting symptoms, physical exam findings, and initial radiographic and laboratory data in the context of their medical condition is felt to place them at decreased risk for further clinical deterioration. Furthermore, it is anticipated that the patient will be medically stable for discharge from the hospital within 2 midnights of admission.   Author: Reubin Milan, MD 01/12/2022 1:32 PM  For on call review [email or web address].   This document was prepared using Dragon voice recognition software and may contain some unintended transcription errors.

## 2022-01-12 NOTE — ED Notes (Signed)
Patient left on own accord °

## 2022-01-12 NOTE — ED Provider Notes (Signed)
[facility] DEPT Provider Note   CSN: [id number] Arrival date & time: 01/12/22  0747     History  Chief Complaint  Patient presents with   Robert Cortez is a 81 y.o. male.  HPI 81 year old man history of diabetes, hypertension,stroke presents with syncope yesterday and fall today.  He states that yesterday he was sitting at the table and he woke up under the table.  He denies any injury from the fall.  He does not recall any prodrome.  He felt well after.  His wife heard him fall and was in the room fairly quickly and does not reports that she thinks that he was not himself.  She does state that sometimes it is hard to tell what his baseline is.  Today he felt weak and fell to the side.  He was able to crawl to bed and pull himself up.  Again he does not have any injuries.  Today he states he just felt like he got off balance and fell.  He did not think that he passed out or lost consciousness today. He has had some cough He has been eating and drinking without difficulty He denies nausea, vomiting, diarrhea He reports taking medications as prescribed    Home Medications Prior to Admission medications   Medication Sig Start Date End Date Taking? Authorizing Provider  amLODipine (NORVASC) 10 MG tablet Take by mouth. 03/28/13   [provider]  aspirin 325 MG tablet Take by mouth.    [provider]  ciclopirox (PENLAC) 8 % solution Apply topically at bedtime. Apply over nail and surrounding skin. Apply daily over previous coat. After seven (7) days, may remove with alcohol and continue cycle. 09/30/21   Criselda Peaches, DPM  glimepiride (AMARYL) 4 MG tablet  06/13/13   [provider]  glucose blood (ACCU-CHEK COMFORT CURVE) test strip  06/04/10   [provider]  INVOKAMET 50-1000 MG TABS TAKE ONE TABLET BY MOUTH TWICE DAILY 12/29/13   Elayne Snare, MD  losartan (COZAAR) 100 MG tablet  08/24/13   [provider]  metoprolol succinate (TOPROL-XL) 50 MG 24 hr tablet  08/15/13   [provider]  mupirocin ointment (BACTROBAN) 2 % Apply 1 Application topically daily. 09/30/21   Criselda Peaches, DPM  simvastatin (ZOCOR) 20 MG tablet  08/21/13   [provider]  [place] 100-33 UNT-MCG/ML SOPN SMARTSIG:32 SUB-Q Every Morning 08/18/21   [provider]  VICTOZA 18 MG/3ML SOPN 1.8 mg daily. 08/29/13   [provider]      Allergies    Bee venom and Shellfish allergy    Review of Systems   Review of Systems  Physical Exam Updated Vital Signs BP (!) 167/74 (BP Location: Right Arm)   Pulse 73   Temp 98.5 F (36.9 C) (Oral)   Resp 18   Ht 1.765 m (5' 9.5")   Wt 86.2 kg   SpO2 96%   BMI 27.66 kg/m  Physical Exam Vitals and nursing note reviewed.  Constitutional:      Appearance: Normal appearance. He is obese.  HENT:     Head: Normocephalic.     Right Ear: External ear normal.     Left Ear: External ear normal.     Nose: Nose normal.     Mouth/Throat:     Pharynx: Oropharynx is clear.  Eyes:     Extraocular Movements: Extraocular movements intact.     Pupils:  Pupils are equal, round, and reactive to light.  Cardiovascular:     Rate and Rhythm: Normal rate and regular rhythm.     Pulses: Normal pulses.     Heart sounds: Normal heart sounds.  Pulmonary:     Effort: Pulmonary effort is normal.     Breath sounds: Normal breath sounds.  Abdominal:     Palpations: Abdomen is soft.  Musculoskeletal:     Cervical back: Normal range of motion.  Skin:    General: Skin is warm and dry.  Neurological:     Mental Status: He is alert.     ED Results / Procedures / Treatments   Labs (all labs ordered are listed, but only abnormal results are displayed) Labs Reviewed  COMPREHENSIVE METABOLIC PANEL - Abnormal; Notable for the following components:      Result Value   Potassium 3.3 (*)    CO2 19 (*)    Glucose, Bld 211 (*)    Creatinine, Ser  1.52 (*)    Total Protein 8.2 (*)    GFR, Estimated 46 (*)    Anion gap 17 (*)    All other components within normal limits  RESP PANEL BY RT-PCR (RSV, FLU A&B, COVID)  RVPGX2  CBC  URINALYSIS, ROUTINE W REFLEX MICROSCOPIC  TROPONIN I (HIGH SENSITIVITY)    EKG EKG Interpretation  Date/Time:  Monday January 12 2022 08:40:33 EST Ventricular Rate:  76 PR Interval:    QRS Duration: 97 QT Interval:  494 QTC Calculation: 556 R Axis:   106 Text Interpretation: Atrial flutter Right axis deviation Minimal ST depression, inferior leads Prolonged QT interval Confirmed by Pattricia Boss [phone]) on 01/12/2022 11:55:09 AM  Radiology CT Cervical Spine Wo Contrast  Result Date: 01/12/2022 CLINICAL DATA:  81 year old male status post fall yesterday. EXAM: CT CERVICAL SPINE WITHOUT CONTRAST TECHNIQUE: Multidetector CT imaging of the cervical spine was performed without intravenous contrast. Multiplanar CT image reconstructions were also generated. RADIATION DOSE REDUCTION: This exam was performed according to the departmental dose-optimization program which includes automated exposure control, adjustment of the mA and/or kV according to patient size and/or use of iterative reconstruction technique. COMPARISON:  Head CT today. FINDINGS: Alignment: Straightening of cervical lordosis. Cervicothoracic junction alignment is within normal limits. Bilateral posterior element alignment is within normal limits. Skull base and vertebrae: Bone mineralization is within normal limits. Visualized skull base is intact. No atlanto-occipital dissociation. C1 and C2 appear intact and aligned. No acute osseous abnormality identified. Soft tissues and spinal canal: No prevertebral fluid or swelling. No visible canal hematoma. Negative visible noncontrast neck soft tissues. Disc levels: Mild degenerative appearing anterolisthesis of C3 on C4. Bilateral facet arthropathy at that level, also widespread throughout the left  cervical spine. Advanced disc and endplate degeneration maximal at C6-C7. Evidence of mild spinal stenosis there. Upper chest: Negative. IMPRESSION: 1. No acute traumatic injury identified in the cervical spine. 2. 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 Electronically Signed   By: Genevie Ann M.D.   On: 01/12/2022 10:03   CT Head Wo Contrast  Result Date: 01/12/2022 CLINICAL DATA:  81 year old male status post fall yesterday. EXAM: CT HEAD WITHOUT CONTRAST TECHNIQUE: Contiguous axial images were obtained from the base of the skull through the vertex without intravenous contrast. RADIATION DOSE REDUCTION: This exam was performed according to the departmental dose-optimization program which includes automated exposure control, adjustment of the mA and/or kV according to patient size and/or use of iterative  reconstruction technique. COMPARISON:  Head CT 06/29/2005.  Brain MRI 06/29/2005. 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 Similar chronic appearing encephalomalacia in the right MCA territory is new since 2007, right middle frontal gyrus posteriorly. And similar chronic appearing encephalomalacia in the left PCA territory, occipital pole is also new. No midline shift, ventriculomegaly, mass effect, evidence of mass lesion, intracranial hemorrhage or evidence of cortically based acute infarction. Vascular: Calcified atherosclerosis at the skull base. No suspicious intracranial vascular hyperdensity. Skull: No acute osseous abnormality identified. Sinuses/Orbits: Visualized paranasal sinuses and mastoids are clear. Other: No orbit or scalp soft tissue injury identified. IMPRESSION: 1. No acute intracranial abnormality or acute traumatic injury identified. 2. 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  Electronically Signed   By: Genevie Ann M.D.   On: 01/12/2022 10:00   DG Chest Port 1 View  Result Date: 01/12/2022 CLINICAL DATA:  Cough EXAM: PORTABLE CHEST 1 VIEW COMPARISON:  09/14/2005 FINDINGS: Cardiac silhouette appears prominent. No pneumonia or pulmonary edema. No pneumothorax or pleural effusion. IMPRESSION: Enlarged cardiac silhouette. Electronically Signed   By: Sammie Bench M.D.   On: 01/12/2022 08:40    Procedures .Critical Care  Performed by: Pattricia Boss, MD Authorized by: Pattricia Boss, MD   Critical care provider statement:    Critical care time (minutes):  45   Critical care was time spent personally by me on the following activities:  Development of treatment plan with patient or surrogate, discussions with consultants, evaluation of patient's response to treatment, examination of patient, ordering and review of laboratory studies, ordering and review of radiographic studies, ordering and performing treatments and interventions, pulse oximetry, re-evaluation of patient's condition and review of old charts     Medications Ordered in ED Medications - No data to display  ED Course/ Medical Decision Making/ A&P Clinical Course as of 01/12/22 1207  Mon Jan 12, 2022  1151 Chest x-Anthoni Geerts reviewed and interpreted enlarged cardiac silhouette [DR]  1151 CT Cervical Spine Wo Contrast CT head and cervical spine reviewed interpreted no evidence of acute traumatic injury noted radiologist interpretation concurs [DR]    Clinical Course User Index [DR] Pattricia Boss, MD                           Medical Decision Making Differential diagnosis includes but is not limited to 1 syncope-including cardiac arrhythmias 2 seizure 3 infection generalized weakness For mechanical fall although yesterday patient has no recall of prior to finding himself out of the chair and under the table.  Evaluation here patient found to be in new onset of atrial flutter. Given new onset atrial  flutter and multiple risk factors, this is concerning for a cardiac etiology of syncope Troponin is pending Hemoglobin is stable Mild hypokalemia is noted Stable CKD with last creatinine 1.6 in August from [facility] Hyperglycemia with blood sugar elevated at 211 without evidence for DKA  Amount and/or Complexity of Data Reviewed External Data Reviewed: labs.    Details: Creatinine reviewed from [facility] in August 2023 Labs: ordered. Decision-making details documented in ED Course. Radiology: ordered and independent interpretation performed. Decision-making details documented in ED Course. ECG/medicine tests: ordered and independent interpretation performed. Decision-making details documented in ED Course. Discussion of management or test interpretation with external provider(s): Care discussed with Dr. Olevia Bowens who will see for admission  Risk Decision regarding hospitalization. Diagnosis  or treatment significantly limited by social determinants of health.           Final Clinical Impression(s) / ED Diagnoses Final diagnoses:  Syncope, unspecified syncope type  New onset atrial flutter ([place])  Fall, initial encounter    Rx / DC Orders ED Discharge Orders     None         Pattricia Boss, MD 01/12/22 1207

## 2022-01-13 ENCOUNTER — Encounter (HOSPITAL_COMMUNITY): Payer: Medicare PPO

## 2022-01-13 DIAGNOSIS — Z5329 Procedure and treatment not carried out because of patient's decision for other reasons: Secondary | ICD-10-CM | POA: Diagnosis present

## 2022-01-13 DIAGNOSIS — Z8673 Personal history of transient ischemic attack (TIA), and cerebral infarction without residual deficits: Secondary | ICD-10-CM | POA: Diagnosis not present

## 2022-01-13 DIAGNOSIS — I480 Paroxysmal atrial fibrillation: Secondary | ICD-10-CM | POA: Diagnosis present

## 2022-01-13 DIAGNOSIS — I639 Cerebral infarction, unspecified: Secondary | ICD-10-CM | POA: Diagnosis not present

## 2022-01-13 DIAGNOSIS — Z91030 Bee allergy status: Secondary | ICD-10-CM | POA: Diagnosis not present

## 2022-01-13 DIAGNOSIS — I10 Essential (primary) hypertension: Secondary | ICD-10-CM | POA: Diagnosis not present

## 2022-01-13 DIAGNOSIS — Z1152 Encounter for screening for COVID-19: Secondary | ICD-10-CM | POA: Diagnosis not present

## 2022-01-13 DIAGNOSIS — I4892 Unspecified atrial flutter: Secondary | ICD-10-CM | POA: Diagnosis present

## 2022-01-13 DIAGNOSIS — N1831 Chronic kidney disease, stage 3a: Secondary | ICD-10-CM | POA: Diagnosis present

## 2022-01-13 DIAGNOSIS — E785 Hyperlipidemia, unspecified: Secondary | ICD-10-CM | POA: Diagnosis present

## 2022-01-13 DIAGNOSIS — E876 Hypokalemia: Secondary | ICD-10-CM | POA: Diagnosis present

## 2022-01-13 DIAGNOSIS — Y92009 Unspecified place in unspecified non-institutional (private) residence as the place of occurrence of the external cause: Secondary | ICD-10-CM | POA: Diagnosis not present

## 2022-01-13 DIAGNOSIS — I3481 Nonrheumatic mitral (valve) annulus calcification: Secondary | ICD-10-CM | POA: Diagnosis present

## 2022-01-13 DIAGNOSIS — G8191 Hemiplegia, unspecified affecting right dominant side: Secondary | ICD-10-CM | POA: Diagnosis present

## 2022-01-13 DIAGNOSIS — Z85038 Personal history of other malignant neoplasm of large intestine: Secondary | ICD-10-CM | POA: Diagnosis not present

## 2022-01-13 DIAGNOSIS — Z8249 Family history of ischemic heart disease and other diseases of the circulatory system: Secondary | ICD-10-CM | POA: Diagnosis not present

## 2022-01-13 DIAGNOSIS — Z833 Family history of diabetes mellitus: Secondary | ICD-10-CM | POA: Diagnosis not present

## 2022-01-13 DIAGNOSIS — E1165 Type 2 diabetes mellitus with hyperglycemia: Secondary | ICD-10-CM | POA: Diagnosis present

## 2022-01-13 DIAGNOSIS — I63522 Cerebral infarction due to unspecified occlusion or stenosis of left anterior cerebral artery: Secondary | ICD-10-CM | POA: Diagnosis present

## 2022-01-13 DIAGNOSIS — R319 Hematuria, unspecified: Secondary | ICD-10-CM | POA: Diagnosis present

## 2022-01-13 DIAGNOSIS — I130 Hypertensive heart and chronic kidney disease with heart failure and stage 1 through stage 4 chronic kidney disease, or unspecified chronic kidney disease: Secondary | ICD-10-CM | POA: Diagnosis present

## 2022-01-13 DIAGNOSIS — W19XXXA Unspecified fall, initial encounter: Secondary | ICD-10-CM

## 2022-01-13 DIAGNOSIS — R55 Syncope and collapse: Secondary | ICD-10-CM | POA: Diagnosis present

## 2022-01-13 DIAGNOSIS — Z79899 Other long term (current) drug therapy: Secondary | ICD-10-CM | POA: Diagnosis not present

## 2022-01-13 DIAGNOSIS — I4891 Unspecified atrial fibrillation: Secondary | ICD-10-CM | POA: Diagnosis not present

## 2022-01-13 DIAGNOSIS — R823 Hemoglobinuria: Secondary | ICD-10-CM | POA: Diagnosis present

## 2022-01-13 DIAGNOSIS — I161 Hypertensive emergency: Secondary | ICD-10-CM | POA: Diagnosis present

## 2022-01-13 DIAGNOSIS — Z91013 Allergy to seafood: Secondary | ICD-10-CM | POA: Diagnosis not present

## 2022-01-13 DIAGNOSIS — R29701 NIHSS score 1: Secondary | ICD-10-CM | POA: Diagnosis present

## 2022-01-13 DIAGNOSIS — E1122 Type 2 diabetes mellitus with diabetic chronic kidney disease: Secondary | ICD-10-CM | POA: Diagnosis present

## 2022-01-13 LAB — CBC
HCT: 43.8 % (ref 39.0–52.0)
Hemoglobin: 14 g/dL (ref 13.0–17.0)
MCH: 29.5 pg (ref 26.0–34.0)
MCHC: 32 g/dL (ref 30.0–36.0)
MCV: 92.4 fL (ref 80.0–100.0)
Platelets: 230 10*3/uL (ref 150–400)
RBC: 4.74 MIL/uL (ref 4.22–5.81)
RDW: 14.8 % (ref 11.5–15.5)
WBC: 8.6 10*3/uL (ref 4.0–10.5)
nRBC: 0 % (ref 0.0–0.2)

## 2022-01-13 LAB — COMPREHENSIVE METABOLIC PANEL
ALT: 21 U/L (ref 0–44)
AST: 22 U/L (ref 15–41)
Albumin: 3.8 g/dL (ref 3.5–5.0)
Alkaline Phosphatase: 96 U/L (ref 38–126)
Anion gap: 12 (ref 5–15)
BUN: 22 mg/dL (ref 8–23)
CO2: 23 mmol/L (ref 22–32)
Calcium: 9.6 mg/dL (ref 8.9–10.3)
Chloride: 105 mmol/L (ref 98–111)
Creatinine, Ser: 1.47 mg/dL — ABNORMAL HIGH (ref 0.61–1.24)
GFR, Estimated: 48 mL/min — ABNORMAL LOW (ref >60)
Glucose, Bld: 164 mg/dL — ABNORMAL HIGH (ref 70–99)
Potassium: 5.1 mmol/L (ref 3.5–5.1)
Sodium: 140 mmol/L (ref 135–145)
Total Bilirubin: 1.2 mg/dL (ref 0.3–1.2)
Total Protein: 8 g/dL (ref 6.5–8.1)

## 2022-01-13 LAB — LIPID PANEL
Cholesterol: 138 mg/dL (ref 0–200)
HDL: 61 mg/dL (ref >40)
LDL Cholesterol: 65 mg/dL (ref 0–99)
Total CHOL/HDL Ratio: 2.3 RATIO
Triglycerides: 60 mg/dL (ref <150)
VLDL: 12 mg/dL (ref 0–40)

## 2022-01-13 LAB — CBG MONITORING, ED
Glucose-Capillary: 168 mg/dL — ABNORMAL HIGH (ref 70–99)
Glucose-Capillary: 174 mg/dL — ABNORMAL HIGH (ref 70–99)
Glucose-Capillary: 181 mg/dL — ABNORMAL HIGH (ref 70–99)

## 2022-01-13 LAB — GLUCOSE, CAPILLARY: Glucose-Capillary: 126 mg/dL — ABNORMAL HIGH (ref 70–99)

## 2022-01-13 LAB — T3, FREE: T3, Free: 2.6 pg/mL (ref 2.0–4.4)

## 2022-01-13 MED ORDER — APIXABAN 5 MG PO TABS
5.0000 mg | ORAL_TABLET | Freq: Two times a day (BID) | ORAL | Status: DC
  Administered 2022-01-13 – 2022-01-14 (×3): 5 mg via ORAL
  Filled 2022-01-13 (×3): qty 1

## 2022-01-13 NOTE — Discharge Instructions (Signed)

## 2022-01-13 NOTE — Progress Notes (Addendum)
Rounding Note    Patient Name: Robert Cortez Date of Encounter: 01/13/2022  [place] Cardiologist: Freada Bergeron, MD   Subjective   No chest pain no SOB   Inpatient Medications    Scheduled Meds:  insulin aspart  0-15 Units Subcutaneous TID WC   insulin glargine-yfgn  32 Units Subcutaneous Daily   simvastatin  20 mg Oral QHS   sodium chloride flush  3 mL Intravenous Q12H   Continuous Infusions:  PRN Meds: acetaminophen **OR** acetaminophen, ondansetron **OR** ondansetron (ZOFRAN) IV   Vital Signs    Vitals:   01/12/22 2030 01/13/22 0022 01/13/22 0625 01/13/22 1040  BP: (!) 151/70 (!) 167/69 (!) 163/64 (!) 163/72  Pulse: 74 73 63 79  Resp: '16 16 16 18  '$ Temp: 98 F (36.7 C) 98.1 F (36.7 C) 97.9 F (36.6 C) 97.7 F (36.5 C)  TempSrc: Oral Oral Oral Oral  SpO2: 97% 98% 97% 99%  Weight:      Height:       No intake or output data in the 24 hours ending 01/13/22 1056    01/12/2022    8:00 AM 04/19/2019   11:42 AM 10/28/2016    9:40 AM  Last 3 Weights  Weight (lbs) 190 lb 187 lb 189 lb 3.2 oz  Weight (kg) 86.183 kg 84.823 kg 85.821 kg      Telemetry    Not on have asked RN to apply - Personally Reviewed  ECG    No new - Personally Reviewed  Physical Exam   GEN: No acute distress.   Neck: No JVD Cardiac: RRR, no murmurs, rubs, or gallops.  Respiratory: Clear to auscultation bilaterally. GI: Soft, nontender, non-distended  MS: No edema; No deformity. Neuro:  Nonfocal  Psych: Normal affect   Labs    High Sensitivity Troponin:   Recent Labs  Lab 01/12/22 1334  TROPONINIHS 48*     Chemistry Recent Labs  Lab 01/12/22 0050 01/12/22 0829 01/12/22 1334 01/12/22 1351 01/13/22 0619  NA 136 138 135  --  140  K 2.9* 3.3* 3.6  --  5.1  CL 100 102 102  --  105  CO2 21* 19* 22  --  23  GLUCOSE 124* 211* 173*  --  164*  BUN '21 23 23  '$ --  22  CREATININE 1.67* 1.52* 1.60*  --  1.47*  CALCIUM 9.5 9.6 9.4  --  9.6  MG  --    --   --  2.7*  --   PROT 7.7 8.2*  --   --  8.0  ALBUMIN 4.0 4.1  --   --  3.8  AST 28 27  --   --  22  ALT 23 24  --   --  21  ALKPHOS 97 103  --   --  96  BILITOT 0.9 0.9  --   --  1.2  GFRNONAA 41* 46* 44*  --  48*  ANIONGAP 15 17* 11  --  12    Lipids  Recent Labs  Lab 01/13/22 0619  CHOL 138  TRIG 60  HDL 61  LDLCALC 65  CHOLHDL 2.3    Hematology Recent Labs  Lab 01/12/22 0050 01/12/22 0829 01/13/22 0619  WBC 9.6 9.6 8.6  RBC 4.76 4.70 4.74  HGB 14.4 13.9 14.0  HCT 43.7 43.2 43.8  MCV 91.8 91.9 92.4  MCH 30.3 29.6 29.5  MCHC 33.0 32.2 32.0  RDW 14.5 14.4 14.8  PLT  221 243 230   Thyroid  Recent Labs  Lab 01/12/22 1351  TSH 1.126  FREET4 1.07    BNPNo results for input(s): "BNP", "PROBNP" in the last 168 hours.  DDimer No results for input(s): "DDIMER" in the last 168 hours.   Radiology    CT ANGIO Cortez NECK W WO CM  Result Date: 01/12/2022 CLINICAL DATA:  Infarcts seen on MRI. EXAM: CT ANGIOGRAPHY Cortez AND NECK TECHNIQUE: Multidetector CT imaging of the Cortez and neck was performed using the standard protocol during bolus administration of intravenous contrast. Multiplanar CT image reconstructions and MIPs were obtained to evaluate the vascular anatomy. Carotid stenosis measurements (when applicable) are obtained utilizing NASCET criteria, using the distal internal carotid diameter as the denominator. RADIATION DOSE REDUCTION: This exam was performed according to the departmental dose-optimization program which includes automated exposure control, adjustment of the mA and/or kV according to patient size and/or use of iterative reconstruction technique. CONTRAST:  84m OMNIPAQUE IOHEXOL 350 MG/ML SOLN COMPARISON:  Same-day brain MRI and noncontrast CT Cortez, MR/MRA Cortez and neck 06/29/2005 FINDINGS: CTA NECK FINDINGS Aortic arch: The imaged aortic arch is normal. The origins of the major branch vessels are patent. The subclavian arteries are patent to the level  imaged. Right carotid system: The right common, internal, and external carotid arteries are patent, with mild plaque at the bifurcation but no hemodynamically significant stenosis or occlusion. There is no dissection or aneurysm. Left carotid system: The left common, internal, and external carotid arteries are patent with minimal plaque at the bifurcation but no hemodynamically significant stenosis or occlusion. There is no dissection or aneurysm. 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 The vertebral artery is then occluded again at the V3/V4 junction. The right vertebral artery is patent throughout. There is no dissection or aneurysm. Skeleton: There is no acute osseous abnormality or suspicious osseous lesion. There is no visible canal hematoma. Other neck: The soft tissues of the neck are unremarkable. Upper chest: The imaged lung apices are clear. Review of the MIP images confirms the above findings CTA Cortez FINDINGS Anterior circulation: There is calcified plaque in the intracranial ICAs resulting in mild-to-moderate stenosis bilaterally. The bilateral MCAs are patent, without proximal stenosis or occlusion. The bilateral A1 and proximal A2 segments are patent. There is short-segment diminished enhancement in the left A2 segment likely reflecting high-grade stenosis corresponding to the infarct seen on the same-day MRI (9-75, 7-257). There is reconstitution of flow distally. There is no aneurysm or AVM. Posterior circulation: The left V4 segment is occluded at the V3/V4 junction with diminutive enhancement of the distal V4 segment likely via collateral flow across the vertebrobasilar junction. PICA is not identified on the left. The right V4 segment is patent with focal moderate to severe stenosis proximally. The distal V4 segment is patent. PICA is identified on the right. The basilar artery is patent. The  bilateral PCAs are patent without proximal high-grade stenosis or occlusion. There is diminutive enhancement of the left distal PCA branches corresponding to the infarct seen on the same-day CT. There is no aneurysm or AVM. Venous sinuses: As permitted by contrast timing, patent. Anatomic variants: None. Review of the MIP images confirms the above findings IMPRESSION: 1. Short-segment diminished enhancement in the left A2 segment likely reflecting high-grade stenosis corresponding to the infarct seen on the same-day MRI. There is reconstitution of flow distally. 2. 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 3. Focal moderate to severe stenosis of the proximal right V4 segment. 4. Diminutive enhancement of the left distal PCA branches corresponding to the infarct seen on the same-day CT. 5. Mild plaque at the carotid siphons and bifurcations without hemodynamically significant stenosis or occlusion. Electronically Signed   By: Valetta Mole M.D.   On: 01/12/2022 18:08   MR BRAIN WO CONTRAST  Result Date: 01/12/2022 CLINICAL DATA:  Syncope/presyncope.  History of colon cancer. EXAM: MRI Cortez WITHOUT CONTRAST TECHNIQUE: Multiplanar, multiecho pulse sequences of the brain and surrounding structures were obtained without intravenous contrast. COMPARISON:  Cortez CT same day FINDINGS: Brain: Diffusion imaging shows restricted diffusion in the left cingulate gyrus and corpus callosum consistent with infarction in the anterior cerebral artery territory. The area of infarction measures about 2 cm in size. No evidence of blood products in association. Extensive old infarction of the inferior cerebellum on the left. Chronic small-vessel ischemic changes of the pons. Old left occipital cortical infarction. Old cortical and subcortical infarction in the right parietal lobe. Few old small vessel  insults elsewhere within the hemispheric white matter. Old small cortical infarction in the right posterior frontal region. No hydrocephalus. No extra-axial collection. Vascular: Abnormal flow pattern in the left vertebral artery. Other major vessels show flow. Skull and upper cervical spine: Normal Sinuses/Orbits: Clear/normal Other: None IMPRESSION: 1. Acute infarction in the left cingulate gyrus and corpus callosum consistent with infarction in the anterior cerebral artery territory. No evidence of hemorrhage. Area of infarction measures about 2 cm. 2. Extensive old infarction of the inferior cerebellum on the left. 3. Old left occipital cortical infarction. Old right parietal cortical and subcortical infarction. Small old right posterior frontal cortical infarction. 4. Abnormal flow pattern in the left vertebral artery, chronic. Electronically Signed   By: Nelson Chimes M.D.   On: 01/12/2022 15:38   CT Cervical Spine Wo Contrast  Result Date: 01/12/2022 CLINICAL DATA:  81 year old male status post fall yesterday. EXAM: CT CERVICAL SPINE WITHOUT CONTRAST TECHNIQUE: Multidetector CT imaging of the cervical spine was performed without intravenous contrast. Multiplanar CT image reconstructions were also generated. RADIATION DOSE REDUCTION: This exam was performed according to the departmental dose-optimization program which includes automated exposure control, adjustment of the mA and/or kV according to patient size and/or use of iterative reconstruction technique. COMPARISON:  Cortez CT today. FINDINGS: Alignment: Straightening of cervical lordosis. Cervicothoracic junction alignment is within normal limits. Bilateral posterior element alignment is within normal limits. Skull base and vertebrae: Bone mineralization is within normal limits. Visualized skull base is intact. No atlanto-occipital dissociation. C1 and C2 appear intact and aligned. No acute osseous abnormality identified. Soft tissues and spinal canal:  No prevertebral fluid or swelling. No visible canal hematoma. Negative visible noncontrast neck soft tissues. Disc levels: Mild degenerative appearing anterolisthesis of C3 on C4. Bilateral facet arthropathy at that level, also widespread throughout the left cervical spine. Advanced disc and endplate degeneration maximal at C6-C7. Evidence of mild spinal stenosis there. Upper chest: Negative. IMPRESSION: 1. No acute traumatic injury identified in the cervical spine. 2. 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 Electronically Signed   By: Genevie Ann M.D.   On: 01/12/2022 10:03   CT Cortez Wo Contrast  Result Date: 01/12/2022 CLINICAL DATA:  81 year old male status post fall yesterday. EXAM: CT Cortez  WITHOUT CONTRAST TECHNIQUE: Contiguous axial images were obtained from the base of the skull through the vertex without intravenous contrast. RADIATION DOSE REDUCTION: This exam was performed according to the departmental dose-optimization program which includes automated exposure control, adjustment of the mA and/or kV according to patient size and/or use of iterative reconstruction technique. COMPARISON:  Cortez CT 06/29/2005.  Brain MRI 06/29/2005. 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 Similar chronic appearing encephalomalacia in the right MCA territory is new since 2007, right middle frontal gyrus posteriorly. And similar chronic appearing encephalomalacia in the left PCA territory, occipital pole is also new. No midline shift, ventriculomegaly, mass effect, evidence of mass lesion, intracranial hemorrhage or evidence of cortically based acute infarction. Vascular: Calcified atherosclerosis at the skull base. No suspicious intracranial vascular hyperdensity. Skull: No acute osseous abnormality identified. Sinuses/Orbits: Visualized paranasal sinuses and mastoids are clear.  Other: No orbit or scalp soft tissue injury identified. IMPRESSION: 1. No acute intracranial abnormality or acute traumatic injury identified. 2. 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 Electronically Signed   By: Genevie Ann M.D.   On: 01/12/2022 10:00   DG Chest Port 1 View  Result Date: 01/12/2022 CLINICAL DATA:  Cough EXAM: PORTABLE CHEST 1 VIEW COMPARISON:  09/14/2005 FINDINGS: Cardiac silhouette appears prominent. No pneumonia or pulmonary edema. No pneumothorax or pleural effusion. IMPRESSION: Enlarged cardiac silhouette. Electronically Signed   By: Sammie Bench M.D.   On: 01/12/2022 08:40    Cardiac Studies   Echo pending   Patient Profile     81 y.o. male hx of DM, HLD, HTN, elevated PSA, colon cancer, mini stroke 2007 with addition of ASA 325, now with new CVA and PAF,    Assessment & Plan    Acute CVA  --Lt cingulate gyrus/corpus callosum. -- Numerous old strokes both cortical/subcortical noted as well.  --rt sided weakness -- new atrial fib has been in and out.  PAF/flutter -- adding po eliquis when able to pass swallowing study   --Echo pending --would continue home metoprolol has not been started will begin at 25 mg today BP though may be holding for permissive hypertension.  --have asked for tele again.   HTN meds on hold  HLD on zocor 20 with LDL 54 in August   DM-2/Hematuria per IM            For questions or updates, please contact [place] Please consult [email or web address] for contact info under        Signed, Cecilie Kicks, NP  01/13/2022, 10:56 AM    Patient seen and examined and agree with Cecilie Kicks, NP as detailed above.   In brief, the patient is a 81 year old male with history of DMII, HTN, HLD, prior TIA in 2007 who presented to the ER with syncope found to be in new aflutter for which Cardiology was consulted.    Patient initially presented to [facility] after the  episode of syncope and slurred speech but left AMA prior to evaluation. Returned to the ER later on 01/12/22 after he had another fall with right sided weakness (no syncope at that time).   In the ER, ECG at 11:55 with Aflutter with HR 70s. Labs notable for K 2.9, Cr 1.67, trop 48. CT Cortez with advanced chronic ischemic disease but otherwise no acute intracranial abnormality. MRI brain showed acute infarction in the left cingulate gyrus and corpus  callosm as well as extensive old infarction in the inferior cerebellum and old left occipital cortical infarction. Has been seen by Neuro and is now started on apixaban. TTE is pending.  Overall, suspect acute presentation related to stroke in the setting of newly diagnosed Afib. Agree with apixaban. Will follow-up TTE and resume metop once cleared by Neuro. Will arrange for follow-up in Afib clinic.    GEN: No acute distress.   Neck: Irregular, no murmurs  Respiratory: Clear to auscultation bilaterally. GI: Soft, nontender, non-distended  MS: No edema; No deformity. Neuro:  AAOx3 Psych: Normal affect     Plan: -Continue apxiaban '5mg'$  BID -Follow-up TTE -Resume metop once cleared by Neuro -Continue simvastatin '20mg'$  daily -Will arrange for follow-up in Afib clinic    Gwyndolyn Kaufman, MD

## 2022-01-13 NOTE — Hospital Course (Signed)
81 y.o.m w/ colon cancer,ileus following gastrointestinal surgery, hypocalcemia, type 2 diabetes, hyperlipidemia, hypertension, TIA, ED, elevated PSA who lost his balance, fell at home due to brief near syncope,wife stated that he was speaking with slurred speech after the fall> he went to [facility] ED 12/18,left AMA before being seen then presented same morning 12/18 to [facility].  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  At this time he has no focal weakness, medically optimized is now on Eliquis starting and resume metoprolol upon discharge.Seen by PT OT no PT OT needs.  Once echo completed he will be discharged home

## 2022-01-13 NOTE — ED Notes (Signed)
PT placed on hospital bed for comfort, lights turned down in hall (as much as possible) and pt given another diet beverage.

## 2022-01-13 NOTE — ED Notes (Signed)
Pt taken to the bathroom via wheelchair.  He is slightly unsteady but informed RN it is because he just work up.  RN found a screen to put around his bed for more privacy.

## 2022-01-13 NOTE — ED Notes (Signed)
Pt very angry that he continues to be in the hall and "has not gone to a room."  Pt yelling at RN stating that she is discriminating against her as other people have gone up an into rooms.  RN explained about the different types of rooms (acuity) however he did not want to allow RN to talk.  "I've worked for the [employer], I know how it works.... so far only black folks are in the hall."  RN assured the pt that there was no forms of discrimination happening and informed him that a hospital bed had in fact been ordered for him so that he can be comfortable.

## 2022-01-13 NOTE — Evaluation (Signed)
Physical Therapy Evaluation Patient Details Name: Robert Cortez MRN: [id number] DOB: 28-Jul-1940 Today's Date: 01/13/2022  History of Present Illness  Robert Cortez is a 81 y.o. male with medical history significant of colon cancer, ileus following gastrointestinal surgery, hypocalcemia, type 2 diabetes, hyperlipidemia, hypertension, TIA, ED, elevated PSA who lost his balance, fell at home due to brief near syncope on 12/18, to ED, left and returned same day.MRI brain shows multiple old strokes, atrophy and new Left ACA infarct in corpus callosum  Clinical Impression   Patient ambulated  around unit, no loss of balance, no need for AD. Patient  currently does not require F/U PT at this time. PT will sign off.   Patient reports his gait  seems WNL.       Recommendations for follow up therapy are one component of a multi-disciplinary discharge planning process, led by the attending physician.  Recommendations may be updated based on patient status, additional functional criteria and insurance authorization.  Follow Up Recommendations No PT follow up      Assistance Recommended at Discharge PRN (stairs)  Patient can return home with the following  Help with stairs or ramp for entrance;Assist for transportation    Equipment Recommendations None recommended by PT  Recommendations for Other Services       Functional Status Assessment Patient has not had a recent decline in their functional status     Precautions / Restrictions Precautions Precautions: Fall Restrictions Weight Bearing Restrictions: No      Mobility  Bed Mobility Overal bed mobility: Independent                  Transfers Overall transfer level: Independent                 General transfer comment: slow to rise    Ambulation/Gait Ambulation/Gait assistance: Supervision Gait Distance (Feet): 300 Feet Assistive device: None Gait Pattern/deviations: Step-through pattern Gait velocity:  slightly decreased Gait velocity interpretation: 1.31 - 2.62 ft/sec, indicative of limited community ambulator   General Gait Details: no loss of balance noted  Stairs            Wheelchair Mobility    Modified Rankin (Stroke Patients Only)       Balance Overall balance assessment: No apparent balance deficits (not formally assessed)                                           Pertinent Vitals/Pain Pain Assessment Pain Assessment: No/denies pain    Home Living Family/patient expects to be discharged to:: Private residence Living Arrangements: Spouse/significant other Available Help at Discharge: Family;Available 24 hours/day Type of Home: House Home Access: Stairs to enter Entrance Stairs-Rails: Left Entrance Stairs-Number of Steps: 13   Home Layout: One level Home Equipment: None      Prior Function Prior Level of Function : Independent/Modified Independent               ADLs Comments: retried, still driving     Hand Dominance   Dominant Hand: Right    Extremity/Trunk Assessment   Upper Extremity Assessment Upper Extremity Assessment: Overall WFL for tasks assessed    Lower Extremity Assessment Lower Extremity Assessment: Overall WFL for tasks assessed    Cervical / Trunk Assessment Cervical / Trunk Assessment: Normal  Communication   Communication: No difficulties  Cognition Arousal/Alertness: Awake/alert Behavior During Therapy: [facility]  for tasks assessed/performed Overall Cognitive Status: Within Functional Limits for tasks assessed                                          General Comments      Exercises     Assessment/Plan    PT Assessment Patient does not need any further PT services  PT Problem List         PT Treatment Interventions      PT Goals (Current goals can be found in the Care Plan section)  Acute Rehab PT Goals Patient Stated Goal: go home PT Goal Formulation: All assessment  and education complete, DC therapy    Frequency       Co-evaluation PT/OT/SLP Co-Evaluation/Treatment: Yes Reason for Co-Treatment: To address functional/ADL transfers PT goals addressed during session: Mobility/safety with mobility OT goals addressed during session: ADL's and self-care       AM-PAC PT "6 Clicks" Mobility  Outcome Measure Help needed turning from your back to your side while in a flat bed without using bedrails?: None Help needed moving from lying on your back to sitting on the side of a flat bed without using bedrails?: None Help needed moving to and from a bed to a chair (including a wheelchair)?: None Help needed standing up from a chair using your arms (e.g., wheelchair or bedside chair)?: None Help needed to walk in hospital room?: None Help needed climbing 3-5 steps with a railing? : A Little 6 Click Score: 23    End of Session Equipment Utilized During Treatment: Gait belt Activity Tolerance: Patient tolerated treatment well Patient left: in bed Nurse Communication: Mobility status PT Visit Diagnosis: History of falling (Z91.81)    Time: 1036-1050 PT Time Calculation (min) (ACUTE ONLY): 14 min   Charges:   PT Evaluation $PT Eval Low Complexity: [place] Office [phone] Weekend pager-[phone]  Claretha Cooper 01/13/2022, 12:11 PM

## 2022-01-13 NOTE — Consult Note (Signed)
[place] Nurse Consult Note: Reason for Consult:Left pretibial wound, full thickness. See photo uploaded to EMR by provider. Wound type:traumatic Pressure Injury POA: N/A Measurement:To be obtained by Bedside RN and documented on Nursing Flow Sheet with next dressing change Wound bed:red. dry Drainage (amount, consistency, odor) scant serous Periwound:intact Dressing procedure/placement/frequency: I have provided conservative care orders for Nursing using a daily NS cleanse, pat dry. This to be followed by the placement of a xeroform gauze (nonadherent antimicrobial) and topped with dry gauze, then secured with silicone foam. A sacral foam dressing and floatation of heels are implemented as PI prevention interventions.  [place] nursing team will not follow, but will remain available to this patient, the nursing and medical teams.  Please re-consult if needed.  Thank you for inviting [place] to participate in this patient's Plan of Care.  Maudie Flakes, MSN, RN, CNS, [place], Serita Grammes, [employer], [employer] phone:  [phone]

## 2022-01-13 NOTE — Evaluation (Signed)
Occupational Therapy Evaluation Patient Details Name: Robert Cortez MRN: [id number] DOB: Jan 06, 1941 Today's Date: 01/13/2022   History of Present Illness Patient  is a 81 y.o. male who lost his balance, fell at home due to brief near syncope on 12/18. 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 All education has been completed and the patient has no further questions. Patient is at his baseline at this time. See below for any follow-up Occupational Therapy or equipment needs. OT is signing off. Thank you for this referral.      Recommendations for follow up therapy are one component of a multi-disciplinary discharge planning process, led by the attending physician.  Recommendations may be updated based on patient status, additional functional criteria and insurance authorization.   Follow Up Recommendations  No OT follow up     Assistance Recommended at Discharge None     Functional Status Assessment  Patient has not had a recent decline in their functional status  Equipment Recommendations  None recommended by OT       Precautions / Restrictions Precautions Precautions: Fall Restrictions Weight Bearing Restrictions: No      Mobility Bed Mobility Overal bed mobility: Independent                  Transfers Overall transfer level: Independent                        Balance Overall balance assessment: No apparent balance deficits (not formally assessed)                                         ADL either performed or assessed with clinical judgement   ADL Overall ADL's : Modified independent                                        General ADL Comments: patient was able to complete LB dressing, toileting, bed mobility and functional mobility with MI with no AD. patient endorsed beign at baseline     Vision   Vision Assessment?: No apparent visual deficits     Perception     Praxis      Pertinent Vitals/Pain Pain Assessment Pain Assessment: No/denies pain     Hand Dominance Right   Extremity/Trunk Assessment Upper Extremity Assessment Upper Extremity Assessment: Overall WFL for tasks assessed   Lower Extremity Assessment Lower Extremity Assessment: Overall WFL for tasks assessed   Cervical / Trunk Assessment Cervical / Trunk Assessment: Normal   Communication Communication Communication: No difficulties   Cognition Arousal/Alertness: Awake/alert Behavior During Therapy: WFL for tasks assessed/performed Overall Cognitive Status: Within Functional Limits for tasks assessed                                       General Comments       Exercises     Shoulder Instructions      Home Living Family/patient expects to be discharged to:: Private residence  Living Arrangements: Spouse/significant other Available Help at Discharge: Family;Available 24 hours/day Type of Home: House Home Access: Stairs to enter [employer] of Steps: 13 Entrance Stairs-Rails: Left Home Layout: One level     Bathroom Shower/Tub: Chief Strategy Officer: None          Prior Functioning/Environment Prior Level of Function : Independent/Modified Independent               ADLs Comments: retried, still driving        OT Problem List:        OT Treatment/Interventions:      OT Goals(Current goals can be found in the care plan section) Acute Rehab OT Goals Patient Stated Goal: to go home today OT Goal Formulation: All assessment and education complete, DC therapy  OT Frequency:      Co-evaluation   Reason for Co-Treatment: To address functional/ADL  transfers PT goals addressed during session: Mobility/safety with mobility OT goals addressed during session: ADL's and self-care      AM-PAC OT "6 Clicks" Daily Activity     Outcome Measure Help from another person eating meals?: None Help from another person taking care of personal grooming?: None Help from another person toileting, which includes using toliet, bedpan, or urinal?: None Help from another person bathing (including washing, rinsing, drying)?: None Help from another person to put on and taking off regular upper body clothing?: None Help from another person to put on and taking off regular lower body clothing?: None 6 Click Score: 24   End of Session Equipment Utilized During Treatment: Gait belt  Activity Tolerance: Patient tolerated treatment well Patient left: in bed;with call bell/phone within reach  OT Visit Diagnosis: Unsteadiness on feet (R26.81)                Time: 2330-0762 OT Time Calculation (min): 18 min Charges:  OT General Charges $OT Visit: 1 Visit OT Evaluation $OT Eval Low Complexity: 1 Low  Sreshta Cressler OTR/L, MS Acute Rehabilitation Department Office# [phone]   Willa Rough 01/13/2022, 12:21 PM

## 2022-01-13 NOTE — Progress Notes (Signed)
Progress Note   Patient: Robert Cortez:[id number] DOB: [birth year] DOA: 01/12/2022     0 DOS: the patient was seen and examined on 01/13/2022   Brief hospital course: 81 y.o. male with medical history significant of colon cancer, ileus following gastrointestinal surgery, hypocalcemia, type 2 diabetes, hyperlipidemia, hypertension, TIA, ED, elevated PSA who lost his balance, fell at home due to brief near syncope yesterday evening.  The patient stated that he did not lose consciousness.  However, he cannot recall all the events.  He denied any injuries.  He his wife stated that he was speaking with slurred speech after the fall.  He went to [facility] ED, but subsequently left AMA before being seen.  After that, he presented later this morning to [facility].  He denied headache, blurred vision, focal weakness or numbness. He denied fever, chills, rhinorrhea, sore throat, wheezing or hemoptysis.  No chest pain, palpitations, diaphoresis, PND, orthopnea, but gets occasional pitting edema of the lower extremities.  No abdominal pain, nausea, emesis, diarrhea, constipation, melena or hematochezia.  No flank pain, dysuria, frequency or hematuria.  No polyuria, polydipsia, polyphagia or blurred vision  During workup, pt was found to have acute CVA. Neurology consulted as well as Cardiology for afib  Assessment and Plan: Principal Problem:   Acute CVA -Brain MR reviewed, findings notable for acute infarct in L cingulate gyus and corpus callosum with extensive old infarct of L cerebellum on L and old L occipital cortical infarct, old R parietal cortical and subcortical infarct, old R post frontal cortical infarct -Now s/p CTA head/neck -Still pending 2d echo -Seen by Neurology who recommends eliquis moving forward with f/u with Dr. Leonie Man -Permissive HTN for now per Neurology, likely can begin to gradually normalize BP in the next day or so -Seen by PT/OT, no needs  identified -recheck bmet in AM   Active Problems:   New onset atrial flutter (HCC) CHA2DS2-VASc Score = 6   -2d echo ordered at presentation, remains pending -Seen by Cardiology who agrees with eliquis -f/u on TTE -Resume metoprolol once no longer needing permissive HTN -Pt to f/u with afib clinic as scheduled by Cardiology     Essential hypertension, benign Holding bp meds for now per above -Likely gradually normalize BP within 24hrs from now -Cardiology recs to resume metoprolol once able to      Type 2 diabetes mellitus with hyperglycemia ([place]) Carbohydrate modified diet. Continue ongoing Vokanamet 150/1000 mg p.o. twice daily. CBG monitoring before meals and bedtime. RI SS before meals. -Glycemic trends stable     Hyperlipidemia Continue simvastatin 20 mg p.o. daily.     Hypokalemia Normalized and corrected Recheck bmet in AM     Hypocarbia With increased anion gap. Repeat chemistry with normal bicarb and anion gap. Venous blood gas showed normal pH.     Stage 3a chronic kidney disease (CKD) (HCC) Renal function seems stable and near baseline Monitor GFR and electrolytes.     Hemoglobinuria Recheck urinalysis as an outpatient. Follow-up with nephrology and/or PCP.      Subjective: States feeling near baseline this AM. Eager to go home soon  Physical Exam: Vitals:   01/12/22 2030 01/13/22 0022 01/13/22 0625 01/13/22 1040  BP: (!) 151/70 (!) 167/69 (!) 163/64 (!) 163/72  Pulse: 74 73 63 79  Resp: '16 16 16 18  '$ Temp: 98 F (36.7 C) 98.1 F (36.7 C) 97.9 F (36.6 C) 97.7 F (36.5 C)  TempSrc: Oral Oral Oral Oral  SpO2: 97% 98% 97% 99%  Weight:      Height:       General exam: Conversant, in no acute distress Respiratory system: normal chest rise, clear, no audible wheezing Cardiovascular system: regular rhythm, s1-s2 Gastrointestinal system: Nondistended, nontender, pos BS Central nervous system: No seizures, no tremors Extremities: No cyanosis, no  joint deformities Skin: No rashes, no pallor Psychiatry: Affect normal // no auditory hallucinations   Data Reviewed:  Labs reviewed: Na 140, K 5.1, Cr 1.47, WBC 8.6, Hgb 14.0, Plts 230  Family Communication: Pt in room, family not at bedside  Disposition: Status is: Observation The patient will require care spanning > 2 midnights and should be moved to inpatient because: Severity of illness  Planned Discharge Destination: Home     Author: Marylu Lund, MD 01/13/2022 3:37 PM  For on call review [email or web address].

## 2022-01-14 ENCOUNTER — Other Ambulatory Visit (HOSPITAL_COMMUNITY): Payer: Self-pay

## 2022-01-14 ENCOUNTER — Inpatient Hospital Stay (HOSPITAL_COMMUNITY): Payer: Medicare PPO

## 2022-01-14 DIAGNOSIS — R55 Syncope and collapse: Secondary | ICD-10-CM

## 2022-01-14 DIAGNOSIS — I4891 Unspecified atrial fibrillation: Secondary | ICD-10-CM | POA: Diagnosis not present

## 2022-01-14 DIAGNOSIS — I639 Cerebral infarction, unspecified: Secondary | ICD-10-CM | POA: Diagnosis not present

## 2022-01-14 DIAGNOSIS — I4892 Unspecified atrial flutter: Secondary | ICD-10-CM | POA: Diagnosis not present

## 2022-01-14 DIAGNOSIS — I10 Essential (primary) hypertension: Secondary | ICD-10-CM | POA: Diagnosis not present

## 2022-01-14 LAB — CBC
HCT: 44.6 % (ref 39.0–52.0)
Hemoglobin: 14.2 g/dL (ref 13.0–17.0)
MCH: 29.2 pg (ref 26.0–34.0)
MCHC: 31.8 g/dL (ref 30.0–36.0)
MCV: 91.8 fL (ref 80.0–100.0)
Platelets: 229 10*3/uL (ref 150–400)
RBC: 4.86 MIL/uL (ref 4.22–5.81)
RDW: 14.6 % (ref 11.5–15.5)
WBC: 7.4 10*3/uL (ref 4.0–10.5)
nRBC: 0 % (ref 0.0–0.2)

## 2022-01-14 LAB — COMPREHENSIVE METABOLIC PANEL
ALT: 18 U/L (ref 0–44)
AST: 25 U/L (ref 15–41)
Albumin: 3.5 g/dL (ref 3.5–5.0)
Alkaline Phosphatase: 93 U/L (ref 38–126)
Anion gap: 12 (ref 5–15)
BUN: 21 mg/dL (ref 8–23)
CO2: 23 mmol/L (ref 22–32)
Calcium: 8.8 mg/dL — ABNORMAL LOW (ref 8.9–10.3)
Chloride: 103 mmol/L (ref 98–111)
Creatinine, Ser: 1.34 mg/dL — ABNORMAL HIGH (ref 0.61–1.24)
GFR, Estimated: 54 mL/min — ABNORMAL LOW (ref >60)
Glucose, Bld: 143 mg/dL — ABNORMAL HIGH (ref 70–99)
Potassium: 3.5 mmol/L (ref 3.5–5.1)
Sodium: 138 mmol/L (ref 135–145)
Total Bilirubin: 0.9 mg/dL (ref 0.3–1.2)
Total Protein: 7.4 g/dL (ref 6.5–8.1)

## 2022-01-14 LAB — ECHOCARDIOGRAM COMPLETE
Area-P 1/2: 3.66 cm2
Calc EF: 68 %
Height: 69.5 in
S' Lateral: 2.3 cm
Single Plane A2C EF: 65 %
Single Plane A4C EF: 69.6 %
Weight: 3407.43 oz

## 2022-01-14 LAB — GLUCOSE, CAPILLARY
Glucose-Capillary: 181 mg/dL — ABNORMAL HIGH (ref 70–99)
Glucose-Capillary: 110 mg/dL — ABNORMAL HIGH (ref 70–99)

## 2022-01-14 LAB — HEMOGLOBIN A1C
Hgb A1c MFr Bld: 8.2 % — ABNORMAL HIGH (ref 4.8–5.6)
Mean Plasma Glucose: 189 mg/dL

## 2022-01-14 MED ORDER — PERFLUTREN LIPID MICROSPHERE
1.0000 mL | INTRAVENOUS | Status: AC | PRN
  Administered 2022-01-14: 3 mL via INTRAVENOUS

## 2022-01-14 MED ORDER — APIXABAN 5 MG PO TABS
5.0000 mg | ORAL_TABLET | Freq: Two times a day (BID) | ORAL | 0 refills | Status: DC
  Filled 2022-01-14: qty 60, 30d supply, fill #0

## 2022-01-14 NOTE — Progress Notes (Signed)
Patient is high fall risk and continues to get out of bed to walk into bathroom without using call light for assist, reinforced and instructed on safety precautions

## 2022-01-14 NOTE — Progress Notes (Signed)
  Transition of Care [facility]) Screening Note   Patient Details  Name: Robert Cortez Date of Birth: 04-07-1940   [facility] of [facility]) CM/SW Contact:    Dessa Phi, RN Phone Number: 01/14/2022, 12:38 PM    Transition of Care Department [facility]) has reviewed patient and no TOC needs have been identified at this time. We will continue to monitor patient advancement through interdisciplinary progression rounds. If new patient transition needs arise, please place a TOC consult.

## 2022-01-14 NOTE — Discharge Summary (Signed)
Physician Discharge Summary  Reinhardt Licausi Hartgrove CWC:[id number] DOB: [birth year] DOA: 01/12/2022  PCP: [name], MD  Admit date: 01/12/2022 Discharge date: 01/14/2022 Recommendations for Outpatient Follow-up:  Follow up with PCP in 1 weeks-call for appointment Follow-up with cardiology as outpatient Follow-up with neurology  Please obtain BMP/CBC in one week  Discharge Dispo: Home Discharge Condition: Stable Code Status:   Code Status: Full Code Diet recommendation:  Diet Order             Diet Carb Modified Fluid consistency: Thin; Room service appropriate? Yes  Diet effective now                    Brief/Interim Summary: 81 y.o.m w/ colon cancer,ileus following gastrointestinal surgery, hypocalcemia, type 2 diabetes, hyperlipidemia, hypertension, TIA, ED, elevated PSA who lost his balance, fell at home due to brief near syncope,wife stated that he was speaking with slurred speech after the fall> he went to [facility] ED 12/18,left AMA before being seen then presented same morning 12/18 to [facility].  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  At this time he has no focal weakness, medically optimized is now on Eliquis starting and resume metoprolol upon discharge.Seen by PT OT no PT OT needs.  Once echo completed he will be discharged home   Discharge Diagnoses:  Principal Problem:   Syncope and collapse Active Problems:   Essential hypertension, benign   Type 2 diabetes mellitus with hyperglycemia (HCC)   Hyperlipidemia   New onset atrial flutter (HCC)   Hypokalemia   Hypocarbia   Stage 3a chronic kidney disease (CKD) (HCC)   Hemoglobinuria   Leg wound,  left   Acute CVA (cerebrovascular accident) ([place])   Stroke ([place])  Acute CVA in the setting of atrial fibrillation: Neurology consulted-appreciate input. 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 - Echocardiogram: With EF 70 to 75%,left ventricle has no regional wall motion abnormalities. There is severe asymmetric left  ventricular hypertrophy of the basal-septal segment. Left ventricular diastolic parameters are indeterminate.  -Continue Eliquis 5 mg twice daily for stroke, continue statin 10 mg daily.   - Risk factor modification - Telemetry monitoring; 30 day event monitor on discharge if no arrythmias captured  - Blood pressure goal   - Normotension  - Management of hypertensive emergency/urgency per standard protocols - PT consult, OT consult, Speech consult  PAF/flutter: Ativan or Eliquis, follow-up echo.  Appreciate cardiology input resume home metoprolol slowly Hypertension BP stable, permissive hypertension.  Resume amlodipine and metoprolol and losartan, check blood pressure at home and discuss with PCP Type 2 diabetes mellitus with -cont  home insulin/home regimen upon discharge, HbA1c borderline controlled Hyperlipidemia LDL at goal continue Zocor CKD stage IIIa baseline Hypokalemia resolved  Discussed w/ cardio and ok for dc home.  Consults: Cardiology, neurology  Subjective: Alert awake oriented ambulatory no complaints. Eager to go home soon for completing his echo  Discharge Exam: Vitals:   01/14/22 0413 01/14/22 1131  BP: (!) 150/82 (!) 148/86  Pulse: 70 66  Resp: 17 18  Temp: 98 F (36.7  C) 97.9 F (36.6 C)  SpO2: 99% 98%   General: Pt is alert, awake, not in acute distress Cardiovascular: RRR, S1/S2 +, no rubs, no gallops Respiratory: CTA bilaterally, no wheezing, no rhonchi Abdominal: Soft, NT, ND, bowel sounds + Extremities: no edema, no cyanosis  Discharge Instructions  Discharge Instructions     Amb referral to AFIB Clinic   Complete by: As directed    Discharge instructions   Complete by: As directed    Please call call MD or return to ER for similar or worsening recurring problem that brought you to hospital or if any fever,nausea/vomiting,abdominal pain, uncontrolled pain, chest pain,  shortness of breath or any other alarming symptoms.  Please follow-up your doctor as instructed in a week time and call the office for appointment.  Please avoid alcohol, smoking, or any other illicit substance and maintain healthy habits including taking your regular medications as prescribed.  You were cared for by a hospitalist during your hospital stay. If you have any questions about your discharge medications or the care you received while you were in the hospital after you are discharged, you can call the unit and ask to speak with the hospitalist on call if the hospitalist that took care of you is not available.  Once you are discharged, your primary care physician will handle any further medical issues. Please note that NO REFILLS for any discharge medications will be authorized once you are discharged, as it is imperative that you return to your primary care physician (or establish a relationship with a primary care physician if you do not have one) for your aftercare needs so that they can reassess your need for medications and monitor your lab values   Discharge wound care:   Complete by: As directed    Wound care to left pretibial area: Cleanse with NS, apt dry. Cover with size appropriate piece of xeroform gauze, top with dry gause and secure with silicone foam.  Change daily.   Increase activity slowly   Complete by: As directed       Allergies as of 01/14/2022       Reactions   Bee Venom Anaphylaxis   Shellfish Allergy Anaphylaxis, Hives        Medication List     STOP taking these medications    aspirin EC 325 MG tablet   ciclopirox 8 % solution Commonly known as: PENLAC   hydrochlorothiazide 50 MG tablet Commonly known as: HYDRODIURIL   mupirocin ointment 2 % Commonly known as: BACTROBAN       TAKE these medications    amLODipine 10 MG tablet Commonly known as: NORVASC Take 10 mg by mouth daily.   apixaban 5 MG Tabs tablet Commonly known as: ELIQUIS Take 1 tablet (5 mg total) by mouth 2 (two) times daily.   EpiPen 2-Pak 0.3 mg/0.3 mL Soaj injection Generic drug: EPINEPHrine Inject 0.3 mg into the muscle as needed for anaphylaxis.   Invokamet [phone] MG Tabs Generic drug: Canagliflozin-metFORMIN HCl Take 1 tablet by mouth 2 (two) times daily. What changed: Another medication with the same name was removed. Continue taking this medication, and follow the directions you see here.   losartan 100 MG tablet Commonly known as: COZAAR  Take 100 mg by mouth daily.   metoprolol succinate 25 MG 24 hr tablet Commonly known as: TOPROL-XL Take 25 mg by mouth daily.   simvastatin 20 MG tablet Commonly known as: ZOCOR Take 20 mg by mouth at bedtime.   Soliqua 100-33 UNT-MCG/ML Sopn Generic drug: Insulin Glargine-Lixisenatide Inject 32 Units into the skin in the morning.   ZyrTEC Allergy 10 MG tablet Generic drug: cetirizine Take 10 mg by mouth daily as needed for allergies or rhinitis.               Discharge Care Instructions  (From admission, onward)           Start     Ordered   01/14/22 0000  Discharge wound care:       Comments: Wound care to left pretibial area: Cleanse with NS, apt dry. Cover with size appropriate piece of xeroform gauze, top with dry gause and secure with silicone foam.  Change daily.   01/14/22 1038            Follow-up Information     Sherran Needs, NP Follow up on 02/03/2022.   Specialties: Nurse Practitioner, Cardiology Why: at 2:00 pm  call for parking instructions. Contact information: [place] [address] [phone]         Freada Bergeron, MD Follow up.   Specialties: Cardiology, Radiology Why: office will call with date and time Contact information: [address] [place] [address] [phone]                Allergies  Allergen Reactions   Bee Venom Anaphylaxis   Shellfish Allergy Anaphylaxis and Hives    The results of significant diagnostics from this hospitalization (including imaging, microbiology, ancillary and laboratory) are listed below for reference.    Microbiology: Recent Results (from the past 240 hour(s))  Resp panel by RT-PCR (RSV, Flu A&B, Covid) Anterior Nasal Swab     Status: None   Collection Time: 01/12/22  8:29 AM   Specimen: Anterior Nasal Swab  Result Value Ref Range Status   SARS Coronavirus 2 by RT PCR NEGATIVE NEGATIVE Final    Comment: (NOTE) SARS-CoV-2 target nucleic acids are NOT DETECTED.  The SARS-CoV-2 RNA is generally detectable in upper respiratory specimens during the acute phase of infection. The lowest concentration of SARS-CoV-2 viral copies this assay can detect is 138 copies/mL. A negative result does not preclude SARS-Cov-2 infection and should not be used as the sole basis for treatment or other patient management decisions. A negative result may occur with  improper specimen collection/handling, submission of specimen other than nasopharyngeal swab, presence of viral mutation(s) within the areas targeted by this assay, and inadequate number of viral copies(<138 copies/mL). A negative result must be combined with clinical observations, patient history, and epidemiological information. The expected result is Negative.  Fact  Sheet for Patients:  EntrepreneurPulse.com.au  Fact Sheet for Healthcare Providers:  IncredibleEmployment.be  This test is no t yet approved or cleared by the [place] FDA and  has been authorized for detection and/or diagnosis of SARS-CoV-2 by FDA under an Emergency Use Authorization (EUA). This EUA will remain  in effect (meaning this test can be used) for the duration of the COVID-19 declaration under Section 564(b)(1) of the Act, 21 U.S.C.section 360bbb-3(b)(1), unless the authorization is terminated  or revoked sooner.       Influenza A by PCR NEGATIVE NEGATIVE Final   Influenza B by PCR  NEGATIVE NEGATIVE Final    Comment: (NOTE) The Xpert Xpress SARS-CoV-2/FLU/RSV plus assay is intended as an aid in the diagnosis of influenza from Nasopharyngeal swab specimens and should not be used as a sole basis for treatment. Nasal washings and aspirates are unacceptable for Xpert Xpress SARS-CoV-2/FLU/RSV testing.  Fact Sheet for Patients: EntrepreneurPulse.com.au  Fact Sheet for Healthcare Providers: IncredibleEmployment.be  This test is not yet approved or cleared by the [place] FDA and has been authorized for detection and/or diagnosis of SARS-CoV-2 by FDA under an Emergency Use Authorization (EUA). This EUA will remain in effect (meaning this test can be used) for the duration of the COVID-19 declaration under Section 564(b)(1) of the Act, 21 U.S.C. section 360bbb-3(b)(1), unless the authorization is terminated or revoked.     Resp Syncytial Virus by PCR NEGATIVE NEGATIVE Final    Comment: (NOTE) Fact Sheet for Patients: EntrepreneurPulse.com.au  Fact Sheet for Healthcare Providers: IncredibleEmployment.be  This test is not yet approved or cleared by the [place] FDA and has been authorized for detection and/or diagnosis of SARS-CoV-2 by FDA under an  Emergency Use Authorization (EUA). This EUA will remain in effect (meaning this test can be used) for the duration of the COVID-19 declaration under Section 564(b)(1) of the Act, 21 U.S.C. section 360bbb-3(b)(1), unless the authorization is terminated or revoked.  Performed at [facility], [place] [address] [place], [place] [address]     Procedures/Studies: ECHOCARDIOGRAM COMPLETE  Result Date: 01/14/2022    ECHOCARDIOGRAM REPORT   Patient Name:   HRISHIKESH HOEG Date of Exam: 01/14/2022 Medical Rec #:  [id number]       Height:       69.5 in Accession #:    [id number]      Weight:       213.0 lb Date of Birth:  17-Mar-1940      BSA:          2.132 m Patient Age:    25 years        BP:           150/82 mmHg Patient Gender: M               HR:           72 bpm. Exam Location:  Inpatient Procedure: 2D Echo, Cardiac Doppler, Color Doppler and Intracardiac            Opacification Agent Indications:    Syncope R55                 Atrial Flutter I48.92                 Atrial Fibrillation I48.91                 Stroke I63.9  History:        Patient has no prior history of Echocardiogram examinations.                 Risk Factors:Hypertension, Diabetes and Dyslipidemia.  Sonographer:    Darlina Sicilian RDCS Referring Phys: [id number] [place]  Sonographer Comments: Technically difficult study due to poor echo windows. IMPRESSIONS  1. Left ventricular ejection fraction, by estimation, is 70 to 75%. The left ventricle has hyperdynamic function. The left ventricle has no regional wall motion abnormalities. There is severe asymmetric left ventricular hypertrophy of the basal-septal segment. Left ventricular diastolic parameters are indeterminate.  2. Right ventricular systolic function is normal. The right ventricular size is  mildly enlarged.  3. The mitral valve is grossly normal. No evidence of mitral valve regurgitation. No evidence of mitral stenosis.  4. The aortic valve is grossly  normal. Aortic valve regurgitation is not visualized. No aortic stenosis is present. Comparison(s): No prior Echocardiogram. FINDINGS  Left Ventricle: Left ventricular ejection fraction, by estimation, is 70 to 75%. The left ventricle has hyperdynamic function. The left ventricle has no regional wall motion abnormalities. Definity contrast agent was given IV to delineate the left ventricular endocardial borders. The left ventricular internal cavity size was small. There is severe asymmetric left ventricular hypertrophy of the basal-septal segment. Left ventricular diastolic parameters are indeterminate. Right Ventricle: The right ventricular size is mildly enlarged. Right vetricular wall thickness was not well visualized. Right ventricular systolic function is normal. Left Atrium: Left atrial size was normal in size. Right Atrium: Right atrial size was normal in size. Pericardium: There is no evidence of pericardial effusion. Mitral Valve: The mitral valve is grossly normal. Mild mitral annular calcification. No evidence of mitral valve regurgitation. No evidence of mitral valve stenosis. Tricuspid Valve: The tricuspid valve is grossly normal. Tricuspid valve regurgitation is not demonstrated. No evidence of tricuspid stenosis. Aortic Valve: The aortic valve is grossly normal. Aortic valve regurgitation is not visualized. No aortic stenosis is present. Pulmonic Valve: The pulmonic valve was grossly normal. Pulmonic valve regurgitation is not visualized. No evidence of pulmonic stenosis. Aorta: The aortic root and ascending aorta are structurally normal, with no evidence of dilitation. IAS/Shunts: No atrial level shunt detected by color flow Doppler.  LEFT VENTRICLE PLAX 2D LVIDd:         3.30 cm     Diastology LVIDs:         2.30 cm     LV e' medial:    6.53 cm/s LV PW:         1.00 cm     LV E/e' medial:  18.8 LV IVS:        1.70 cm     LV e' lateral:   7.13 cm/s LVOT diam:     2.00 cm     LV E/e' lateral: 17.2 LV  SV:         65 LV SV Index:   30 LVOT Area:     3.14 cm  LV Volumes (MOD) LV vol d, MOD A2C: 40.0 ml LV vol d, MOD A4C: 61.9 ml LV vol s, MOD A2C: 14.0 ml LV vol s, MOD A4C: 18.8 ml LV SV MOD A2C:     26.0 ml LV SV MOD A4C:     61.9 ml LV SV MOD BP:      34.5 ml RIGHT VENTRICLE RV S prime:     15.60 cm/s TAPSE (M-mode): 2.2 cm LEFT ATRIUM             Index LA diam:        3.20 cm 1.50 cm/m LA Vol (A2C):   39.8 ml 18.66 ml/m LA Vol (A4C):   44.7 ml 20.96 ml/m LA Biplane Vol: 44.6 ml 20.92 ml/m  AORTIC VALVE LVOT Vmax:   103.00 cm/s LVOT Vmean:  79.800 cm/s LVOT VTI:    0.206 m  AORTA Ao Root diam: 3.50 cm Ao Asc diam:  3.40 cm MITRAL VALVE MV Area (PHT): 3.66 cm     SHUNTS MV Decel Time: 207 msec     Systemic VTI:  0.21 m MV E velocity: 122.67 cm/s  Systemic Diam: 2.00 cm Lyman Bishop MD  Electronically signed by Lyman Bishop MD Signature Date/Time: 01/14/2022/11:18:05 AM    Final    CT ANGIO HEAD NECK W WO CM  Result Date: 01/12/2022 CLINICAL DATA:  Infarcts seen on MRI. EXAM: CT ANGIOGRAPHY HEAD AND NECK TECHNIQUE: Multidetector CT imaging of the head and neck was performed using the standard protocol during bolus administration of intravenous contrast. Multiplanar CT image reconstructions and MIPs were obtained to evaluate the vascular anatomy. Carotid stenosis measurements (when applicable) are obtained utilizing NASCET criteria, using the distal internal carotid diameter as the denominator. RADIATION DOSE REDUCTION: This exam was performed according to the departmental dose-optimization program which includes automated exposure control, adjustment of the mA and/or kV according to patient size and/or use of iterative reconstruction technique. CONTRAST:  25m OMNIPAQUE IOHEXOL 350 MG/ML SOLN COMPARISON:  Same-day brain MRI and noncontrast CT head, MR/MRA head and neck 06/29/2005 FINDINGS: CTA NECK FINDINGS Aortic arch: The imaged aortic arch is normal. The origins of the major branch vessels are  patent. The subclavian arteries are patent to the level imaged. Right carotid system: The right common, internal, and external carotid arteries are patent, with mild plaque at the bifurcation but no hemodynamically significant stenosis or occlusion. There is no dissection or aneurysm. Left carotid system: The left common, internal, and external carotid arteries are patent with minimal plaque at the bifurcation but no hemodynamically significant stenosis or occlusion. There is no dissection or aneurysm. 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 The vertebral artery is then occluded again at the V3/V4 junction. The right vertebral artery is patent throughout. There is no dissection or aneurysm. Skeleton: There is no acute osseous abnormality or suspicious osseous lesion. There is no visible canal hematoma. Other neck: The soft tissues of the neck are unremarkable. Upper chest: The imaged lung apices are clear. Review of the MIP images confirms the above findings CTA HEAD FINDINGS Anterior circulation: There is calcified plaque in the intracranial ICAs resulting in mild-to-moderate stenosis bilaterally. The bilateral MCAs are patent, without proximal stenosis or occlusion. The bilateral A1 and proximal A2 segments are patent. There is short-segment diminished enhancement in the left A2 segment likely reflecting high-grade stenosis corresponding to the infarct seen on the same-day MRI (9-75, 7-257). There is reconstitution of flow distally. There is no aneurysm or AVM. Posterior circulation: The left V4 segment is occluded at the V3/V4 junction with diminutive enhancement of the distal V4 segment likely via collateral flow across the vertebrobasilar junction. PICA is not identified on the left. The right V4 segment is patent with focal moderate to severe stenosis proximally. The distal V4 segment is patent. PICA is identified  on the right. The basilar artery is patent. The bilateral PCAs are patent without proximal high-grade stenosis or occlusion. There is diminutive enhancement of the left distal PCA branches corresponding to the infarct seen on the same-day CT. There is no aneurysm or AVM. Venous sinuses: As permitted by contrast timing, patent. Anatomic variants: None. Review of the MIP images confirms the above findings IMPRESSION: 1. Short-segment diminished enhancement in the left A2 segment likely reflecting high-grade stenosis corresponding to the infarct seen on the same-day MRI. There is reconstitution of flow distally. 2. 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 3. Focal moderate to severe stenosis of the proximal  right V4 segment. 4. Diminutive enhancement of the left distal PCA branches corresponding to the infarct seen on the same-day CT. 5. Mild plaque at the carotid siphons and bifurcations without hemodynamically significant stenosis or occlusion. Electronically Signed   By: Valetta Mole M.D.   On: 01/12/2022 18:08   MR BRAIN WO CONTRAST  Result Date: 01/12/2022 CLINICAL DATA:  Syncope/presyncope.  History of colon cancer. EXAM: MRI HEAD WITHOUT CONTRAST TECHNIQUE: Multiplanar, multiecho pulse sequences of the brain and surrounding structures were obtained without intravenous contrast. COMPARISON:  Head CT same day FINDINGS: Brain: Diffusion imaging shows restricted diffusion in the left cingulate gyrus and corpus callosum consistent with infarction in the anterior cerebral artery territory. The area of infarction measures about 2 cm in size. No evidence of blood products in association. Extensive old infarction of the inferior cerebellum on the left. Chronic small-vessel ischemic changes of the pons. Old left occipital cortical infarction. Old cortical and subcortical infarction in the  right parietal lobe. Few old small vessel insults elsewhere within the hemispheric white matter. Old small cortical infarction in the right posterior frontal region. No hydrocephalus. No extra-axial collection. Vascular: Abnormal flow pattern in the left vertebral artery. Other major vessels show flow. Skull and upper cervical spine: Normal Sinuses/Orbits: Clear/normal Other: None IMPRESSION: 1. Acute infarction in the left cingulate gyrus and corpus callosum consistent with infarction in the anterior cerebral artery territory. No evidence of hemorrhage. Area of infarction measures about 2 cm. 2. Extensive old infarction of the inferior cerebellum on the left. 3. Old left occipital cortical infarction. Old right parietal cortical and subcortical infarction. Small old right posterior frontal cortical infarction. 4. Abnormal flow pattern in the left vertebral artery, chronic. Electronically Signed   By: Nelson Chimes M.D.   On: 01/12/2022 15:38   CT Cervical Spine Wo Contrast  Result Date: 01/12/2022 CLINICAL DATA:  81 year old male status post fall yesterday. EXAM: CT CERVICAL SPINE WITHOUT CONTRAST TECHNIQUE: Multidetector CT imaging of the cervical spine was performed without intravenous contrast. Multiplanar CT image reconstructions were also generated. RADIATION DOSE REDUCTION: This exam was performed according to the departmental dose-optimization program which includes automated exposure control, adjustment of the mA and/or kV according to patient size and/or use of iterative reconstruction technique. COMPARISON:  Head CT today. FINDINGS: Alignment: Straightening of cervical lordosis. Cervicothoracic junction alignment is within normal limits. Bilateral posterior element alignment is within normal limits. Skull base and vertebrae: Bone mineralization is within normal limits. Visualized skull base is intact. No atlanto-occipital dissociation. C1 and C2 appear intact and aligned. No acute osseous abnormality  identified. Soft tissues and spinal canal: No prevertebral fluid or swelling. No visible canal hematoma. Negative visible noncontrast neck soft tissues. Disc levels: Mild degenerative appearing anterolisthesis of C3 on C4. Bilateral facet arthropathy at that level, also widespread throughout the left cervical spine. Advanced disc and endplate degeneration maximal at C6-C7. Evidence of mild spinal stenosis there. Upper chest: Negative. IMPRESSION: 1. No acute traumatic injury identified in the cervical spine. 2. 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 Electronically Signed   By: Genevie Ann M.D.   On: 01/12/2022 10:03   CT Head Wo Contrast  Result Date: 01/12/2022 CLINICAL DATA:  81 year old male status post fall yesterday. EXAM: CT HEAD WITHOUT CONTRAST TECHNIQUE: Contiguous axial images were obtained from the base of the skull through the vertex without intravenous contrast. RADIATION DOSE REDUCTION: This exam was performed according to the departmental dose-optimization program which  includes automated exposure control, adjustment of the mA and/or kV according to patient size and/or use of iterative reconstruction technique. COMPARISON:  Head CT 06/29/2005.  Brain MRI 06/29/2005. 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 Similar chronic appearing encephalomalacia in the right MCA territory is new since 2007, right middle frontal gyrus posteriorly. And similar chronic appearing encephalomalacia in the left PCA territory, occipital pole is also new. No midline shift, ventriculomegaly, mass effect, evidence of mass lesion, intracranial hemorrhage or evidence of cortically based acute infarction. Vascular: Calcified atherosclerosis at the skull base. No suspicious intracranial vascular hyperdensity. Skull: No acute osseous abnormality identified. Sinuses/Orbits: Visualized  paranasal sinuses and mastoids are clear. Other: No orbit or scalp soft tissue injury identified. IMPRESSION: 1. No acute intracranial abnormality or acute traumatic injury identified. 2. 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 Electronically Signed   By: Genevie Ann M.D.   On: 01/12/2022 10:00   DG Chest Port 1 View  Result Date: 01/12/2022 CLINICAL DATA:  Cough EXAM: PORTABLE CHEST 1 VIEW COMPARISON:  09/14/2005 FINDINGS: Cardiac silhouette appears prominent. No pneumonia or pulmonary edema. No pneumothorax or pleural effusion. IMPRESSION: Enlarged cardiac silhouette. Electronically Signed   By: Sammie Bench M.D.   On: 01/12/2022 08:40    Labs: BNP (last 3 results) No results for input(s): "BNP" in the last 8760 hours. Basic Metabolic Panel: Recent Labs  Lab 01/12/22 0050 01/12/22 0829 01/12/22 1334 01/12/22 1351 01/13/22 0619 01/14/22 0452  NA 136 138 135  --  140 138  K 2.9* 3.3* 3.6  --  5.1 3.5  CL 100 102 102  --  105 103  CO2 21* 19* 22  --  23 23  GLUCOSE 124* 211* 173*  --  164* 143*  BUN '21 23 23  '$ --  22 21  CREATININE 1.67* 1.52* 1.60*  --  1.47* 1.34*  CALCIUM 9.5 9.6 9.4  --  9.6 8.8*  MG  --   --   --  2.7*  --   --    Liver Function Tests: Recent Labs  Lab 01/12/22 0050 01/12/22 0829 01/13/22 0619 01/14/22 0452  AST '28 27 22 25  '$ ALT '23 24 21 18  '$ ALKPHOS 97 103 96 93  BILITOT 0.9 0.9 1.2 0.9  PROT 7.7 8.2* 8.0 7.4  ALBUMIN 4.0 4.1 3.8 3.5   No results for input(s): "LIPASE", "AMYLASE" in the last 168 hours. No results for input(s): "AMMONIA" in the last 168 hours. CBC: Recent Labs  Lab 01/12/22 0050 01/12/22 0829 01/13/22 0619 01/14/22 0452  WBC 9.6 9.6 8.6 7.4  NEUTROABS 6.1  --   --   --   HGB 14.4 13.9 14.0 14.2  HCT 43.7 43.2 43.8 44.6  MCV 91.8 91.9 92.4 91.8  PLT 221 243 230 229   Cardiac Enzymes: No results for input(s): "CKTOTAL", "CKMB", "CKMBINDEX", "TROPONINI" in the  last 168 hours. BNP: Invalid input(s): "POCBNP" CBG: Recent Labs  Lab 01/13/22 0834 01/13/22 1153 01/13/22 1629 01/14/22 0744 01/14/22 1129  GLUCAP 168* 174* 126* 181* 110*   D-Dimer No results for input(s): "DDIMER" in the last 72 hours. Hgb A1c Recent Labs    01/13/22 0619  HGBA1C 8.2*   Lipid Profile Recent Labs    01/13/22 0619  CHOL 138  HDL 61  LDLCALC 65  TRIG 60  CHOLHDL 2.3   Thyroid function studies Recent Labs  01/12/22 1351  TSH 1.126  T3FREE 2.6   Anemia work up No results for input(s): "VITAMINB12", "FOLATE", "FERRITIN", "TIBC", "IRON", "RETICCTPCT" in the last 72 hours. Urinalysis    Component Value Date/Time   COLORURINE YELLOW 01/12/2022 [place] 01/12/2022 1217   LABSPEC 1.026 01/12/2022 1217   PHURINE 5.0 01/12/2022 1217   GLUCOSEU >=500 (A) 01/12/2022 1217   HGBUR MODERATE (A) 01/12/2022 1217   BILIRUBINUR NEGATIVE 01/12/2022 1217   KETONESUR 5 (A) 01/12/2022 1217   PROTEINUR NEGATIVE 01/12/2022 1217   NITRITE NEGATIVE 01/12/2022 1217   LEUKOCYTESUR NEGATIVE 01/12/2022 1217   Sepsis Labs Recent Labs  Lab 01/12/22 0050 01/12/22 0829 01/13/22 0619 01/14/22 0452  WBC 9.6 9.6 8.6 7.4   Microbiology Recent Results (from the past 240 hour(s))  Resp panel by RT-PCR (RSV, Flu A&B, Covid) Anterior Nasal Swab     Status: None   Collection Time: 01/12/22  8:29 AM   Specimen: Anterior Nasal Swab  Result Value Ref Range Status   SARS Coronavirus 2 by RT PCR NEGATIVE NEGATIVE Final    Comment: (NOTE) SARS-CoV-2 target nucleic acids are NOT DETECTED.  The SARS-CoV-2 RNA is generally detectable in upper respiratory specimens during the acute phase of infection. The lowest concentration of SARS-CoV-2 viral copies this assay can detect is 138 copies/mL. A negative result does not preclude SARS-Cov-2 infection and should not be used as the sole basis for treatment or other patient management decisions. A negative  result may occur with  improper specimen collection/handling, submission of specimen other than nasopharyngeal swab, presence of viral mutation(s) within the areas targeted by this assay, and inadequate number of viral copies(<138 copies/mL). A negative result must be combined with clinical observations, patient history, and epidemiological information. The expected result is Negative.  Fact Sheet for Patients:  EntrepreneurPulse.com.au  Fact Sheet for Healthcare Providers:  IncredibleEmployment.be  This test is no t yet approved or cleared by the [place] FDA and  has been authorized for detection and/or diagnosis of SARS-CoV-2 by FDA under an Emergency Use Authorization (EUA). This EUA will remain  in effect (meaning this test can be used) for the duration of the COVID-19 declaration under Section 564(b)(1) of the Act, 21 U.S.C.section 360bbb-3(b)(1), unless the authorization is terminated  or revoked sooner.       Influenza A by PCR NEGATIVE NEGATIVE Final   Influenza B by PCR NEGATIVE NEGATIVE Final    Comment: (NOTE) The Xpert Xpress SARS-CoV-2/FLU/RSV plus assay is intended as an aid in the diagnosis of influenza from Nasopharyngeal swab specimens and should not be used as a sole basis for treatment. Nasal washings and aspirates are unacceptable for Xpert Xpress SARS-CoV-2/FLU/RSV testing.  Fact Sheet for Patients: EntrepreneurPulse.com.au  Fact Sheet for Healthcare Providers: IncredibleEmployment.be  This test is not yet approved or cleared by the [place] FDA and has been authorized for detection and/or diagnosis of SARS-CoV-2 by FDA under an Emergency Use Authorization (EUA). This EUA will remain in effect (meaning this test can be used) for the duration of the COVID-19 declaration under Section 564(b)(1) of the Act, 21 U.S.C. section 360bbb-3(b)(1), unless the authorization is  terminated or revoked.     Resp Syncytial Virus by PCR NEGATIVE NEGATIVE Final    Comment: (NOTE) Fact Sheet for Patients: EntrepreneurPulse.com.au  Fact Sheet for Healthcare Providers: IncredibleEmployment.be  This test is not yet approved or cleared by the [place] FDA and has been authorized for detection and/or diagnosis of SARS-CoV-2 by FDA  under an Emergency Use Authorization (EUA). This EUA will remain in effect (meaning this test can be used) for the duration of the COVID-19 declaration under Section 564(b)(1) of the Act, 21 U.S.C. section 360bbb-3(b)(1), unless the authorization is terminated or revoked.  Performed at [facility], [place] [address] [place], [place] [address]   Time coordinating discharge: 25 minutes  SIGNED: Antonieta Pert, MD  [facility] 01/14/2022, 12:13 PM  If 7PM-7AM, please contact night-coverage [email or web address]

## 2022-01-14 NOTE — Progress Notes (Signed)
Wound to LLE measured and verified by second RN, Marijean Bravo RN

## 2022-01-14 NOTE — Plan of Care (Signed)
  Problem: Nutrition: Goal: Risk of aspiration will decrease Outcome: Progressing Goal: Dietary intake will improve Outcome: Progressing   

## 2022-01-14 NOTE — Plan of Care (Signed)
Problem: Education: Goal: Knowledge of disease or condition will improve Outcome: Adequate for Discharge Goal: Understanding of medication regimen will improve Outcome: Adequate for Discharge Goal: Individualized Educational Video(s) Outcome: Adequate for Discharge   Problem: Activity: Goal: Ability to tolerate increased activity will improve Outcome: Adequate for Discharge   Problem: Cardiac: Goal: Ability to achieve and maintain adequate cardiopulmonary perfusion will improve Outcome: Adequate for Discharge   Problem: Health Behavior/Discharge Planning: Goal: Ability to safely manage health-related needs after discharge will improve Outcome: Adequate for Discharge   Problem: Education: Goal: Knowledge of condition and prescribed therapy will improve Outcome: Adequate for Discharge   Problem: Cardiac: Goal: Will achieve and/or maintain adequate cardiac output Outcome: Adequate for Discharge   Problem: Physical Regulation: Goal: Complications related to the disease process, condition or treatment will be avoided or minimized Outcome: Adequate for Discharge   Problem: Education: Goal: Knowledge of disease or condition will improve Outcome: Adequate for Discharge Goal: Knowledge of secondary prevention will improve (MUST DOCUMENT ALL) Outcome: Adequate for Discharge Goal: Knowledge of patient specific risk factors will improve Elta [place] N/A or DELETE if not current risk factor) Outcome: Adequate for Discharge   Problem: Ischemic Stroke/TIA Tissue Perfusion: Goal: Complications of ischemic stroke/TIA will be minimized Outcome: Adequate for Discharge   Problem: Coping: Goal: Will verbalize positive feelings about self Outcome: Adequate for Discharge Goal: Will identify appropriate support needs Outcome: Adequate for Discharge   Problem: Health Behavior/Discharge Planning: Goal: Ability to manage health-related needs will improve Outcome: Adequate for Discharge Goal:  Goals will be collaboratively established with patient/family Outcome: Adequate for Discharge   Problem: Self-Care: Goal: Ability to participate in self-care as condition permits will improve Outcome: Adequate for Discharge Goal: Verbalization of feelings and concerns over difficulty with self-care will improve Outcome: Adequate for Discharge Goal: Ability to communicate needs accurately will improve Outcome: Adequate for Discharge   Problem: Nutrition: Goal: Risk of aspiration will decrease 01/14/2022 1231 by Saunders Glance, RN Outcome: Adequate for Discharge 01/14/2022 1022 by Saunders Glance, RN Outcome: Progressing Goal: Dietary intake will improve 01/14/2022 1231 by Saunders Glance, RN Outcome: Adequate for Discharge 01/14/2022 1022 by Saunders Glance, RN Outcome: Progressing   Problem: Education: Goal: Ability to describe self-care measures that may prevent or decrease complications (Diabetes Survival Skills Education) will improve Outcome: Adequate for Discharge Goal: Individualized Educational Video(s) Outcome: Adequate for Discharge   Problem: Coping: Goal: Ability to adjust to condition or change in health will improve Outcome: Adequate for Discharge   Problem: Fluid Volume: Goal: Ability to maintain a balanced intake and output will improve Outcome: Adequate for Discharge   Problem: Health Behavior/Discharge Planning: Goal: Ability to identify and utilize available resources and services will improve Outcome: Adequate for Discharge Goal: Ability to manage health-related needs will improve Outcome: Adequate for Discharge   Problem: Metabolic: Goal: Ability to maintain appropriate glucose levels will improve Outcome: Adequate for Discharge   Problem: Nutritional: Goal: Maintenance of adequate nutrition will improve Outcome: Adequate for Discharge Goal: Progress toward achieving an optimal weight will improve Outcome: Adequate for Discharge    Problem: Skin Integrity: Goal: Risk for impaired skin integrity will decrease Outcome: Adequate for Discharge   Problem: Tissue Perfusion: Goal: Adequacy of tissue perfusion will improve Outcome: Adequate for Discharge   Problem: Education: Goal: Knowledge of General Education information will improve Description: Including pain rating scale, medication(s)/side effects and non-pharmacologic comfort measures Outcome: Adequate for Discharge   Problem: Health Behavior/Discharge Planning: Goal: Ability to manage health-related  needs will improve Outcome: Adequate for Discharge   Problem: Clinical Measurements: Goal: Ability to maintain clinical measurements within normal limits will improve Outcome: Adequate for Discharge Goal: Will remain free from infection Outcome: Adequate for Discharge Goal: Diagnostic test results will improve Outcome: Adequate for Discharge Goal: Respiratory complications will improve Outcome: Adequate for Discharge Goal: Cardiovascular complication will be avoided Outcome: Adequate for Discharge   Problem: Activity: Goal: Risk for activity intolerance will decrease Outcome: Adequate for Discharge   Problem: Nutrition: Goal: Adequate nutrition will be maintained Outcome: Adequate for Discharge   Problem: Coping: Goal: Level of anxiety will decrease Outcome: Adequate for Discharge   Problem: Elimination: Goal: Will not experience complications related to bowel motility Outcome: Adequate for Discharge Goal: Will not experience complications related to urinary retention Outcome: Adequate for Discharge   Problem: Pain Managment: Goal: General experience of comfort will improve Outcome: Adequate for Discharge

## 2022-01-14 NOTE — Progress Notes (Signed)
Mobility Specialist - Progress Note  Pre-mobility: 76 bpm HR, During mobility: 93 bpm HR,  Post-mobility: 67 bpm HR,    01/14/22 1045  Mobility  Activity Ambulated independently in hallway  Level of Assistance Independent  Assistive Device None  Distance Ambulated (ft) 500 ft  Range of Motion/Exercises Active  Activity Response Tolerated well  Mobility Referral Yes  $Mobility charge 1 Mobility   Pt was found in bed and agreeable to ambulate. Had no complaints and at EOS returned to bed with necessities in reach.  Ferd Hibbs Mobility Specialist

## 2022-01-14 NOTE — Progress Notes (Addendum)
Patient and spouse given discharge, medication, and follow up instructions, verbalized understanding, IV and telemetry monitor removed, personal belongings with patient, spouse to transport home 

## 2022-01-14 NOTE — Progress Notes (Addendum)
Rounding Note    Patient Name: Robert Cortez Date of Encounter: 01/14/2022  [place] Cardiologist: Freada Bergeron, MD   Subjective   Patient appears comfortable in bed this morning and denies symptoms of irregular heartbeat including palpitations, chest pain, shortness of breath. He has apparently been ambulating around his room without difficulty and feels as though he has had neurological recovery.  Inpatient Medications    Scheduled Meds:  apixaban  5 mg Oral BID   insulin aspart  0-15 Units Subcutaneous TID WC   insulin glargine-yfgn  32 Units Subcutaneous Daily   simvastatin  20 mg Oral QHS   sodium chloride flush  3 mL Intravenous Q12H   Continuous Infusions:  PRN Meds: acetaminophen **OR** acetaminophen, ondansetron **OR** ondansetron (ZOFRAN) IV   Vital Signs    Vitals:   01/13/22 1719 01/13/22 2049 01/14/22 0413 01/14/22 0500  BP: (!) 170/81 (!) 161/81 (!) 150/82   Pulse:  62 70   Resp: '18 17 17   '$ Temp: 98 F (36.7 C) 98.5 F (36.9 C) 98 F (36.7 C)   TempSrc:  Oral Oral   SpO2: 100% 100% 99%   Weight:    96.6 kg  Height:        Intake/Output Summary (Last 24 hours) at 01/14/2022 0836 Last data filed at 01/14/2022 0831 Gross per 24 hour  Intake 720 ml  Output --  Net 720 ml      01/14/2022    5:00 AM 01/12/2022    8:00 AM 04/19/2019   11:42 AM  Last 3 Weights  Weight (lbs) 212 lb 15.4 oz 190 lb 187 lb  Weight (kg) 96.6 kg 86.183 kg 84.823 kg      Telemetry    Leads are currently off patient. Appears to be in slow atrial fibrillation as of last time telemetry available - Personally Reviewed  ECG    No new tracing  Physical Exam   GEN: No acute distress.   Neck: No JVD Cardiac: irregularly irregular, no murmurs, rubs, or gallops.  Respiratory: Clear to auscultation bilaterally. GI: Soft, nontender, non-distended  MS: No edema; No deformity. Neuro:  Nonfocal. LLE seems to be slightly weaker than right.  Psych:  Normal affect   Labs    High Sensitivity Troponin:   Recent Labs  Lab 01/12/22 1334  TROPONINIHS 48*     Chemistry Recent Labs  Lab 01/12/22 0829 01/12/22 1334 01/12/22 1351 01/13/22 0619 01/14/22 0452  NA 138 135  --  140 138  K 3.3* 3.6  --  5.1 3.5  CL 102 102  --  105 103  CO2 19* 22  --  23 23  GLUCOSE 211* 173*  --  164* 143*  BUN 23 23  --  22 21  CREATININE 1.52* 1.60*  --  1.47* 1.34*  CALCIUM 9.6 9.4  --  9.6 8.8*  MG  --   --  2.7*  --   --   PROT 8.2*  --   --  8.0 7.4  ALBUMIN 4.1  --   --  3.8 3.5  AST 27  --   --  22 25  ALT 24  --   --  21 18  ALKPHOS 103  --   --  96 93  BILITOT 0.9  --   --  1.2 0.9  GFRNONAA 46* 44*  --  48* 54*  ANIONGAP 17* 11  --  12 12    Lipids  Recent  Labs  Lab 01/13/22 0619  CHOL 138  TRIG 60  HDL 61  LDLCALC 65  CHOLHDL 2.3    Hematology Recent Labs  Lab 01/12/22 0829 01/13/22 0619 01/14/22 0452  WBC 9.6 8.6 7.4  RBC 4.70 4.74 4.86  HGB 13.9 14.0 14.2  HCT 43.2 43.8 44.6  MCV 91.9 92.4 91.8  MCH 29.6 29.5 29.2  MCHC 32.2 32.0 31.8  RDW 14.4 14.8 14.6  PLT 243 230 229   Thyroid  Recent Labs  Lab 01/12/22 1351  TSH 1.126  FREET4 1.07    BNPNo results for input(s): "BNP", "PROBNP" in the last 168 hours.  DDimer No results for input(s): "DDIMER" in the last 168 hours.   Radiology    CT ANGIO HEAD NECK W WO CM  Result Date: 01/12/2022 CLINICAL DATA:  Infarcts seen on MRI. EXAM: CT ANGIOGRAPHY HEAD AND NECK TECHNIQUE: Multidetector CT imaging of the head and neck was performed using the standard protocol during bolus administration of intravenous contrast. Multiplanar CT image reconstructions and MIPs were obtained to evaluate the vascular anatomy. Carotid stenosis measurements (when applicable) are obtained utilizing NASCET criteria, using the distal internal carotid diameter as the denominator. RADIATION DOSE REDUCTION: This exam was performed according to the departmental dose-optimization program  which includes automated exposure control, adjustment of the mA and/or kV according to patient size and/or use of iterative reconstruction technique. CONTRAST:  52m OMNIPAQUE IOHEXOL 350 MG/ML SOLN COMPARISON:  Same-day brain MRI and noncontrast CT head, MR/MRA head and neck 06/29/2005 FINDINGS: CTA NECK FINDINGS Aortic arch: The imaged aortic arch is normal. The origins of the major branch vessels are patent. The subclavian arteries are patent to the level imaged. Right carotid system: The right common, internal, and external carotid arteries are patent, with mild plaque at the bifurcation but no hemodynamically significant stenosis or occlusion. There is no dissection or aneurysm. Left carotid system: The left common, internal, and external carotid arteries are patent with minimal plaque at the bifurcation but no hemodynamically significant stenosis or occlusion. There is no dissection or aneurysm. 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 The vertebral artery is then occluded again at the V3/V4 junction. The right vertebral artery is patent throughout. There is no dissection or aneurysm. Skeleton: There is no acute osseous abnormality or suspicious osseous lesion. There is no visible canal hematoma. Other neck: The soft tissues of the neck are unremarkable. Upper chest: The imaged lung apices are clear. Review of the MIP images confirms the above findings CTA HEAD FINDINGS Anterior circulation: There is calcified plaque in the intracranial ICAs resulting in mild-to-moderate stenosis bilaterally. The bilateral MCAs are patent, without proximal stenosis or occlusion. The bilateral A1 and proximal A2 segments are patent. There is short-segment diminished enhancement in the left A2 segment likely reflecting high-grade stenosis corresponding to the infarct seen on the same-day MRI (9-75, 7-257). There is reconstitution of  flow distally. There is no aneurysm or AVM. Posterior circulation: The left V4 segment is occluded at the V3/V4 junction with diminutive enhancement of the distal V4 segment likely via collateral flow across the vertebrobasilar junction. PICA is not identified on the left. The right V4 segment is patent with focal moderate to severe stenosis proximally. The distal V4 segment is patent. PICA is identified on the right. The basilar artery is patent. The bilateral PCAs are patent without proximal high-grade stenosis or occlusion. There is diminutive enhancement  of the left distal PCA branches corresponding to the infarct seen on the same-day CT. There is no aneurysm or AVM. Venous sinuses: As permitted by contrast timing, patent. Anatomic variants: None. Review of the MIP images confirms the above findings IMPRESSION: 1. Short-segment diminished enhancement in the left A2 segment likely reflecting high-grade stenosis corresponding to the infarct seen on the same-day MRI. There is reconstitution of flow distally. 2. 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 3. Focal moderate to severe stenosis of the proximal right V4 segment. 4. Diminutive enhancement of the left distal PCA branches corresponding to the infarct seen on the same-day CT. 5. Mild plaque at the carotid siphons and bifurcations without hemodynamically significant stenosis or occlusion. Electronically Signed   By: Valetta Mole M.D.   On: 01/12/2022 18:08   MR BRAIN WO CONTRAST  Result Date: 01/12/2022 CLINICAL DATA:  Syncope/presyncope.  History of colon cancer. EXAM: MRI HEAD WITHOUT CONTRAST TECHNIQUE: Multiplanar, multiecho pulse sequences of the brain and surrounding structures were obtained without intravenous contrast. COMPARISON:  Head CT same day FINDINGS: Brain: Diffusion imaging shows restricted diffusion in the left  cingulate gyrus and corpus callosum consistent with infarction in the anterior cerebral artery territory. The area of infarction measures about 2 cm in size. No evidence of blood products in association. Extensive old infarction of the inferior cerebellum on the left. Chronic small-vessel ischemic changes of the pons. Old left occipital cortical infarction. Old cortical and subcortical infarction in the right parietal lobe. Few old small vessel insults elsewhere within the hemispheric white matter. Old small cortical infarction in the right posterior frontal region. No hydrocephalus. No extra-axial collection. Vascular: Abnormal flow pattern in the left vertebral artery. Other major vessels show flow. Skull and upper cervical spine: Normal Sinuses/Orbits: Clear/normal Other: None IMPRESSION: 1. Acute infarction in the left cingulate gyrus and corpus callosum consistent with infarction in the anterior cerebral artery territory. No evidence of hemorrhage. Area of infarction measures about 2 cm. 2. Extensive old infarction of the inferior cerebellum on the left. 3. Old left occipital cortical infarction. Old right parietal cortical and subcortical infarction. Small old right posterior frontal cortical infarction. 4. Abnormal flow pattern in the left vertebral artery, chronic. Electronically Signed   By: Nelson Chimes M.D.   On: 01/12/2022 15:38   CT Cervical Spine Wo Contrast  Result Date: 01/12/2022 CLINICAL DATA:  81 year old male status post fall yesterday. EXAM: CT CERVICAL SPINE WITHOUT CONTRAST TECHNIQUE: Multidetector CT imaging of the cervical spine was performed without intravenous contrast. Multiplanar CT image reconstructions were also generated. RADIATION DOSE REDUCTION: This exam was performed according to the departmental dose-optimization program which includes automated exposure control, adjustment of the mA and/or kV according to patient size and/or use of iterative reconstruction technique.  COMPARISON:  Head CT today. FINDINGS: Alignment: Straightening of cervical lordosis. Cervicothoracic junction alignment is within normal limits. Bilateral posterior element alignment is within normal limits. Skull base and vertebrae: Bone mineralization is within normal limits. Visualized skull base is intact. No atlanto-occipital dissociation. C1 and C2 appear intact and aligned. No acute osseous abnormality identified. Soft tissues and spinal canal: No prevertebral fluid or swelling. No visible canal hematoma. Negative visible noncontrast neck soft tissues. Disc levels: Mild degenerative appearing anterolisthesis of C3 on C4. Bilateral facet arthropathy at that level, also widespread throughout the left cervical spine. Advanced disc and  endplate degeneration maximal at C6-C7. Evidence of mild spinal stenosis there. Upper chest: Negative. IMPRESSION: 1. No acute traumatic injury identified in the cervical spine. 2. 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 Electronically Signed   By: Genevie Ann M.D.   On: 01/12/2022 10:03   CT Head Wo Contrast  Result Date: 01/12/2022 CLINICAL DATA:  81 year old male status post fall yesterday. EXAM: CT HEAD WITHOUT CONTRAST TECHNIQUE: Contiguous axial images were obtained from the base of the skull through the vertex without intravenous contrast. RADIATION DOSE REDUCTION: This exam was performed according to the departmental dose-optimization program which includes automated exposure control, adjustment of the mA and/or kV according to patient size and/or use of iterative reconstruction technique. COMPARISON:  Head CT 06/29/2005.  Brain MRI 06/29/2005. 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 Similar chronic appearing encephalomalacia in the right MCA territory is new since 2007, right middle frontal gyrus posteriorly. And similar chronic  appearing encephalomalacia in the left PCA territory, occipital pole is also new. No midline shift, ventriculomegaly, mass effect, evidence of mass lesion, intracranial hemorrhage or evidence of cortically based acute infarction. Vascular: Calcified atherosclerosis at the skull base. No suspicious intracranial vascular hyperdensity. Skull: No acute osseous abnormality identified. Sinuses/Orbits: Visualized paranasal sinuses and mastoids are clear. Other: No orbit or scalp soft tissue injury identified. IMPRESSION: 1. No acute intracranial abnormality or acute traumatic injury identified. 2. 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 Electronically Signed   By: Genevie Ann M.D.   On: 01/12/2022 10:00   DG Chest Port 1 View  Result Date: 01/12/2022 CLINICAL DATA:  Cough EXAM: PORTABLE CHEST 1 VIEW COMPARISON:  09/14/2005 FINDINGS: Cardiac silhouette appears prominent. No pneumonia or pulmonary edema. No pneumothorax or pleural effusion. IMPRESSION: Enlarged cardiac silhouette. Electronically Signed   By: Sammie Bench M.D.   On: 01/12/2022 08:40    Cardiac Studies   TTE pending today.  Patient Profile     81 year old male with history of DMII, HTN, HLD, prior TIA in 2007 who presented to the ER with syncope found to be in new aflutter for which Cardiology was consulted.   Assessment & Plan    Paroxysmal atrial fibrillation/flutter  Patient admitted in the setting of acute CVA with observed atrial flutter. Has intermittently been on telemetry but this morning still appears to be in a rate controlled atrial fibrillation rhythm.   Continue Eliquis '5mg'$  BID. Reiterated the importance of not missing doses. Resume home Metoprolol once cleared by neurology (currently pursuing permissive hypertension). TTE pending Afib clinic appointment on 02/03/22 at 2:00pm  Acute Cerebrovascular Accident  Patient admitted in the setting of focal  neurological deficits/fall. MRI notable for acute infarction in left cingulate gyrus and corpus callosum. Extensive old infarct in inferior cerebellum and left occipital cortical regions as well. Neurology is following along.   Eliquis '5mg'$  BID Simvastatin '20mg'$  daily Resume Metoprolol once cleared to do so by neurology  Hypertension  Anti-hypertension meds currently on hold. Resume per neurology.      For questions or updates, please contact [facility] Please consult [email or web address] for contact info under        Signed, Lily Kocher, PA-C  01/14/2022, 8:36 AM     Patient seen and examined and agree with Lily Kocher, PA-C as detailed above.   In brief, the patient is a 81  year old male with history of DMII, HTN, HLD, prior TIA in 2007 who presented to the ER with syncope found to be in new aflutter for which Cardiology was consulted.    Patient initially presented to [facility] after the episode of syncope and slurred speech but left AMA prior to evaluation. Returned to the ER later on 01/12/22 after he had another fall with right sided weakness (no syncope at that time).   In the ER, ECG at 11:55 with Aflutter with HR 70s. Labs notable for K 2.9, Cr 1.67, trop 48. CT head with advanced chronic ischemic disease but otherwise no acute intracranial abnormality. MRI brain showed acute infarction in the left cingulate gyrus and corpus callosm as well as extensive old infarction in the inferior cerebellum and old left occipital cortical infarction. Has been seen by Neuro and is now started on apixaban. TTE with LVEF 70-75%, severe thickening of the basal septal segment, RV mildly dilated but with normal systolic function, no valve disease.   Overall, suspect acute presentation related to stroke in the setting of newly diagnosed Afib. Will continue apixaban and arrange follow-up in Afib clinic. Okay to discharge home from a CV perspective.   GEN: No acute distress. Laying in bed  Neck:  Irregular, no murmurs  Respiratory: CTAB, no wheezes GI: Soft, nontender, non-distended  MS: No edema; No deformity. Neuro:  AAOx3 Psych: Normal affect     Plan: -Continue apxiaban '5mg'$  BID -Resume home antihypertensives on discharge -Continue simvastatin '20mg'$  daily -Follow-up in Afib clinic has been arranged  Okay to discharge home from a CV perspective.   Gwyndolyn Kaufman, MD

## 2022-01-15 ENCOUNTER — Telehealth: Payer: Self-pay

## 2022-01-15 NOTE — Patient Outreach (Signed)
  Care Coordination TOC Note Transition Care Management Unsuccessful Follow-up Telephone Call  Date of discharge and from where:  [facility] 01/12/22-01/14/22  Attempts:  1st Attempt  Reason for unsuccessful TCM follow-up call:  Left voice message  Johnney Killian, RN, BSN, CCM Care Management Coordinator [facility] Phone: [phone]: [phone]

## 2022-01-15 NOTE — Patient Outreach (Signed)
  Care Coordination TOC Note Transition Care Management Follow-up Telephone Call Date of discharge and from where: [facility] 01/12/22-01/14/22 How have you been since you were released from the hospital? "I am feeling pretty good". Any questions or concerns? No  Items Reviewed: Did the pt receive and understand the discharge instructions provided? Yes  Medications obtained and verified? Yes  Other? No  Any new allergies since your discharge? No  Dietary orders reviewed? No Do you have support at home? Yes   Home Care and Equipment/Supplies: Were home health services ordered? no If so, what is the name of the agency? N/A  Has the agency set up a time to come to the patient's home? no Were any new equipment or medical supplies ordered?  No What is the name of the medical supply agency? N/A Were you able to get the supplies/equipment? no Do you have any questions related to the use of the equipment or supplies? No  Functional Questionnaire: (I = Independent and D = Dependent) ADLs: I  Bathing/Dressing- I  Meal Prep- I  Eating- I  Maintaining continence- I  Transferring/Ambulation- I  Managing Meds- I  Follow up appointments reviewed:  PCP Hospital f/u appt confirmed? Yes  Scheduled to see Dr. Ola Spurr on 02/03/22 @ 0900. [facility] f/u appt confirmed? Yes  Scheduled to see Roderic Palau, NP on 02/03/22 @ 2:00. Are transportation arrangements needed? No  If their condition worsens, is the pt aware to call PCP or go to the Emergency Dept.? Yes Was the patient provided with contact information for the PCP's office or ED? Yes Was to pt encouraged to call back with questions or concerns? Yes  SDOH assessments and interventions completed:   Yes SDOH Interventions Today    Flowsheet Row Most Recent Value  SDOH Interventions   Transportation Interventions Intervention Not Indicated  Financial Strain Interventions Intervention Not Indicated       Care  Coordination Interventions:  No Care Coordination interventions needed at this time.   Encounter Outcome:  Pt. Visit Completed

## 2022-01-21 DIAGNOSIS — J189 Pneumonia, unspecified organism: Secondary | ICD-10-CM | POA: Diagnosis not present

## 2022-01-21 DIAGNOSIS — R051 Acute cough: Secondary | ICD-10-CM | POA: Diagnosis not present

## 2022-02-02 DIAGNOSIS — I4891 Unspecified atrial fibrillation: Secondary | ICD-10-CM | POA: Diagnosis not present

## 2022-02-02 DIAGNOSIS — N189 Chronic kidney disease, unspecified: Secondary | ICD-10-CM | POA: Diagnosis not present

## 2022-02-02 DIAGNOSIS — Z8673 Personal history of transient ischemic attack (TIA), and cerebral infarction without residual deficits: Secondary | ICD-10-CM | POA: Diagnosis not present

## 2022-02-02 DIAGNOSIS — E782 Mixed hyperlipidemia: Secondary | ICD-10-CM | POA: Diagnosis not present

## 2022-02-02 DIAGNOSIS — Z794 Long term (current) use of insulin: Secondary | ICD-10-CM | POA: Diagnosis not present

## 2022-02-02 DIAGNOSIS — I129 Hypertensive chronic kidney disease with stage 1 through stage 4 chronic kidney disease, or unspecified chronic kidney disease: Secondary | ICD-10-CM | POA: Diagnosis not present

## 2022-02-02 DIAGNOSIS — E1122 Type 2 diabetes mellitus with diabetic chronic kidney disease: Secondary | ICD-10-CM | POA: Diagnosis not present

## 2022-02-02 DIAGNOSIS — I1A0 Resistant hypertension: Secondary | ICD-10-CM | POA: Diagnosis not present

## 2022-02-02 DIAGNOSIS — E785 Hyperlipidemia, unspecified: Secondary | ICD-10-CM | POA: Diagnosis not present

## 2022-02-03 ENCOUNTER — Ambulatory Visit (HOSPITAL_COMMUNITY)
Admit: 2022-02-03 | Discharge: 2022-02-03 | Disposition: A | Discharge status: Home / Self Care | Payer: Medicare PPO | Attending: Nurse Practitioner | Admitting: Nurse Practitioner

## 2022-02-03 VITALS — BP 178/64 | HR 45 | Ht 69.5 in | Wt 209.2 lb

## 2022-02-03 DIAGNOSIS — Z79899 Other long term (current) drug therapy: Secondary | ICD-10-CM | POA: Insufficient documentation

## 2022-02-03 DIAGNOSIS — Z7901 Long term (current) use of anticoagulants: Secondary | ICD-10-CM | POA: Insufficient documentation

## 2022-02-03 DIAGNOSIS — I4892 Unspecified atrial flutter: Secondary | ICD-10-CM

## 2022-02-03 DIAGNOSIS — E119 Type 2 diabetes mellitus without complications: Secondary | ICD-10-CM | POA: Insufficient documentation

## 2022-02-03 DIAGNOSIS — E785 Hyperlipidemia, unspecified: Secondary | ICD-10-CM | POA: Insufficient documentation

## 2022-02-03 DIAGNOSIS — Z85038 Personal history of other malignant neoplasm of large intestine: Secondary | ICD-10-CM | POA: Insufficient documentation

## 2022-02-03 DIAGNOSIS — I4891 Unspecified atrial fibrillation: Secondary | ICD-10-CM | POA: Diagnosis not present

## 2022-02-03 DIAGNOSIS — Z8673 Personal history of transient ischemic attack (TIA), and cerebral infarction without residual deficits: Secondary | ICD-10-CM | POA: Diagnosis not present

## 2022-02-03 DIAGNOSIS — I10 Essential (primary) hypertension: Secondary | ICD-10-CM | POA: Diagnosis not present

## 2022-02-03 DIAGNOSIS — R972 Elevated prostate specific antigen [PSA]: Secondary | ICD-10-CM | POA: Insufficient documentation

## 2022-02-03 MED ORDER — APIXABAN 5 MG PO TABS: 5.0000 mg | ORAL_TABLET | Freq: Two times a day (BID) | ORAL | 3 refills | Status: DC

## 2022-02-03 NOTE — Progress Notes (Signed)
Primary Care Physician: [name], MD Referring Physician:   Jaishaun Mcnab Faries is a 82 y.o. male with a h/o w/ colon cancer, ileus following gastrointestinal surgery, hypocalcemia, type 2 diabetes, hyperlipidemia, hypertension, TIA, ED, elevated PSA who lost his balance, fell at home due to brief near syncope,wife stated that he was speaking with slurred speech after the fall. He went to [facility] ED 12/18, left AMA before being seen then presented same morning 12/18 to [facility].  Patient was admitted, 12/18 to 12/20.   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 He had no focal weakness, medically optimized. He  is now on Eliquis starting and resume metoprolol upon discharge. Echo completed.    oday, he denies symptoms of palpitations, chest pain, shortness of breath, orthopnea, PND, lower extremity edema, dizziness, presyncope, syncope, or neurologic sequela. The patient is tolerating medications without difficulties and is otherwise without complaint today.   Past Medical History:  Diagnosis Date   Colon cancer ([place])    Diabetes mellitus without complication ([place])    HLD (hyperlipidemia)    Hypertension    Mini stroke 2007   Past Surgical History:  Procedure Laterality Date   COLON SURGERY     Precancerous lesion    Current Outpatient Medications  Medication Sig Dispense Refill   amLODipine (NORVASC) 10 MG tablet Take 10 mg by mouth daily.     Cyanocobalamin (VITAMIN B-12 PO) Take 1 tablet by mouth every morning.     EPINEPHrine (EPIPEN 2-PAK) 0.3 mg/0.3 mL IJ SOAJ injection Inject 0.3 mg into the muscle as needed for anaphylaxis.     INVOKAMET [phone] MG TABS Take 1  tablet by mouth 2 (two) times daily.     losartan (COZAAR) 100 MG tablet Take 100 mg by mouth daily.     metoprolol succinate (TOPROL-XL) 25 MG 24 hr tablet Take 12.5 mg by mouth daily.     simvastatin (ZOCOR) 20 MG tablet Take 20 mg by mouth at bedtime.     SOLIQUA 100-33 UNT-MCG/ML SOPN Inject 32 Units into the skin in the morning.     ZYRTEC ALLERGY 10 MG tablet Take 10 mg by mouth daily as needed for allergies or rhinitis.     apixaban (ELIQUIS) 5 MG TABS tablet Take 1 tablet (5 mg total) by mouth 2 (two) times daily. 60 tablet 3   No current facility-administered medications for this encounter.    Allergies  Allergen Reactions   Bee Venom Anaphylaxis   Shellfish Allergy Anaphylaxis and Hives    Social History   Socioeconomic History   Marital status: Married    Spouse name: Not on file   Number of children: Not on file   Years of education: Not on file   Highest education level: Not on file  Occupational History   Not on file  Tobacco Use   Smoking status: Never   Smokeless tobacco: Never  Vaping Use   Vaping Use: Never used  Substance and Sexual Activity   Alcohol use: Yes   Drug use: Never   Sexual activity: Not Currently  Other Topics Concern   Not on file  Social History Narrative   Not on file  Social Determinants of Health   Financial Resource Strain: Low Risk  (01/15/2022)   Overall Financial Resource Strain (CARDIA)    Difficulty of Paying Living Expenses: Not very hard  Food Insecurity: No Food Insecurity (01/12/2022)   Hunger Vital Sign    Worried About Running Out of Food in the Last Year: Never true    Ran Out of Food in the Last Year: Never true  Transportation Needs: No Transportation Needs (01/15/2022)   PRAPARE - Hydrologist (Medical): No    Lack of Transportation (Non-Medical): No  Physical Activity: Not on file  Stress: Not on file  Social Connections: Not on file  Intimate Partner Violence: Not At Risk  (01/12/2022)   Humiliation, Afraid, Rape, and Kick questionnaire    Fear of Current or Ex-Partner: No    Emotionally Abused: No    Physically Abused: No    Sexually Abused: No    Family History  Problem Relation Age of Onset   Diabetes Mother    Hypertension Mother     ROS- All systems are reviewed and negative except as per the HPI above  Physical Exam: Vitals:   02/03/22 1347  BP: (!) 178/64  Pulse: (!) 45  Weight: 94.9 kg  Height: 5' 9.5" (1.765 m)   Wt Readings from Last 3 Encounters:  02/03/22 94.9 kg  01/14/22 96.6 kg  04/19/19 84.8 kg    Labs: Lab Results  Component Value Date   NA 138 01/14/2022   K 3.5 01/14/2022   CL 103 01/14/2022   CO2 23 01/14/2022   GLUCOSE 143 (H) 01/14/2022   BUN 21 01/14/2022   CREATININE 1.34 (H) 01/14/2022   CALCIUM 8.8 (L) 01/14/2022   MG 2.7 (H) 01/12/2022   No results found for: "INR" Lab Results  Component Value Date   CHOL 138 01/13/2022   HDL 61 01/13/2022   LDLCALC 65 01/13/2022   TRIG 60 01/13/2022     GEN- The patient is well appearing, alert and oriented x 3 today.   Head- normocephalic, atraumatic Eyes-  Sclera clear, conjunctiva pink Ears- hearing intact Oropharynx- clear Neck- supple, no JVP Lymph- no cervical lymphadenopathy Lungs- Clear to ausculation bilaterally, normal work of breathing Heart- irregular rate and rhythm, no murmurs, rubs or gallops, PMI not laterally displaced GI- soft, NT, ND, + BS Extremities- no clubbing, cyanosis, or edema MS- no significant deformity or atrophy Skin- no rash or lesion Psych- euthymic mood, full affect Neuro- strength and sensation are intact  EKG-Vent. rate 45 BPM PR interval * ms QRS duration 106 ms QT/QTcB 476/411 ms P-R-T axes * 108 46 Atrial flutter Rightward axis Abnormal ECG When compared with ECG of 12-Jan-2022 08:40, PREVIOUS ECG IS PRESENT  Echo- 1. Left ventricular ejection fraction, by estimation, is 70 to 75%. The  left ventricle has  hyperdynamic function. The left ventricle has no  regional wall motion abnormalities. There is severe asymmetric left  ventricular hypertrophy of the basal-septal  segment. Left ventricular diastolic parameters are indeterminate.   2. Right ventricular systolic function is normal. The right ventricular  size is mildly enlarged.   3. The mitral valve is grossly normal. No evidence of mitral valve  regurgitation. No evidence of mitral stenosis.   4. The aortic valve is grossly normal. Aortic valve regurgitation is not  visualized. No aortic stenosis is present.   Comparison(s): No prior Echocardiogram.   Assessment and Plan:  1. Afib /flutter He is in a slow atrial  flutter today with HR's in the 40's He denies symptoms of bradycardia  Will lower metoprolol to 12.5 mg daily Discussed pursuing cardioversion, risk vrs benefit and he is in agreement  Cbc/bmet  2. CHA2DS2VASc  score of 5 Continue eliquis 5 mg bid  States he has not missed any doses since d/c 12/20 Reminded not to miss doses with upcoming cardioversion   He will have f/u one week after cardioversion in afib clinic  Markeria Goetsch C. Kenzee Bassin, [facility] [address] [place], [place] [address] [phone]

## 2022-02-03 NOTE — Patient Instructions (Addendum)
Decrease metoprolol to 1/2 tablet once a day   Cardioversion scheduled for: Wednesday, January 24th   - Arrive at the [facility] and go to admitting at 930am   - Do not eat or drink anything after midnight the night prior to your procedure.   - Take all your morning medication (except diabetic medications) with a sip of water prior to arrival.  - You will not be able to drive home after your procedure.    - Do NOT miss any doses of your blood thinner - if you should miss a dose please notify our office immediately.   - If you feel as if you go back into normal rhythm prior to scheduled cardioversion, please notify our office immediately.  If your procedure is canceled in the cardioversion suite you will be charged a cancellation fee.   If you are on weekly OZEMPIC, TRULICITY, MOUNJARO, WEGOVY, OR BYDUREON  Hold medication 7 days prior to scheduled procedure/anesthesia.  Restart medication on the normal dosing day after scheduled procedure/anesthesia  If you are on daily BYETTA, VICTOZA, ADLYXIN, OR RYBELSUS:   Hold medication 24 hours prior to scheduled procedure/anesthesia.   Restart medication on the following day after scheduled procedure/anesthesia   For those patients who have a scheduled procedure/anesthesia on the same day of the week as their dose, hold the medication on the day of surgery.  They can take their scheduled dose the week before.  **Patients on the above medications scheduled for elective procedures that have not held the medication for the appropriate amount of time are at risk of cancellation or change in the anesthetic plan.

## 2022-02-05 ENCOUNTER — Telehealth: Payer: Self-pay | Admitting: Physician Assistant

## 2022-02-05 NOTE — Telephone Encounter (Signed)
Left message for patient to call back so we can figure out who called him.

## 2022-02-05 NOTE — Telephone Encounter (Signed)
Patient was returning a phone call

## 2022-02-11 DIAGNOSIS — E1165 Type 2 diabetes mellitus with hyperglycemia: Secondary | ICD-10-CM | POA: Diagnosis not present

## 2022-02-13 DIAGNOSIS — E118 Type 2 diabetes mellitus with unspecified complications: Secondary | ICD-10-CM | POA: Diagnosis not present

## 2022-02-16 ENCOUNTER — Ambulatory Visit: Payer: Medicare PPO | Admitting: Physician Assistant

## 2022-02-16 ENCOUNTER — Ambulatory Visit (HOSPITAL_COMMUNITY)
Admission: RE | Admit: 2022-02-16 | Discharge: 2022-02-16 | Disposition: A | Discharge status: Home / Self Care | Payer: Medicare PPO | Source: Ambulatory Visit | Attending: Physician Assistant | Admitting: Physician Assistant

## 2022-02-16 DIAGNOSIS — I4892 Unspecified atrial flutter: Secondary | ICD-10-CM | POA: Diagnosis not present

## 2022-02-16 LAB — CBC
HCT: 46.4 % (ref 39.0–52.0)
Hemoglobin: 14.4 g/dL (ref 13.0–17.0)
MCH: 29.4 pg (ref 26.0–34.0)
MCHC: 31 g/dL (ref 30.0–36.0)
MCV: 94.9 fL (ref 80.0–100.0)
Platelets: 217 10*3/uL (ref 150–400)
RBC: 4.89 MIL/uL (ref 4.22–5.81)
RDW: 14.7 % (ref 11.5–15.5)
WBC: 9.1 10*3/uL (ref 4.0–10.5)
nRBC: 0 % (ref 0.0–0.2)

## 2022-02-16 LAB — BASIC METABOLIC PANEL
Anion gap: 11 (ref 5–15)
BUN: 10 mg/dL (ref 8–23)
CO2: 25 mmol/L (ref 22–32)
Calcium: 8.8 mg/dL — ABNORMAL LOW (ref 8.9–10.3)
Chloride: 103 mmol/L (ref 98–111)
Creatinine, Ser: 1.34 mg/dL — ABNORMAL HIGH (ref 0.61–1.24)
GFR, Estimated: 54 mL/min — ABNORMAL LOW (ref >60)
Glucose, Bld: 82 mg/dL (ref 70–99)
Potassium: 4.1 mmol/L (ref 3.5–5.1)
Sodium: 139 mmol/L (ref 135–145)

## 2022-02-17 ENCOUNTER — Encounter (HOSPITAL_COMMUNITY): Payer: Self-pay | Admitting: Anesthesiology

## 2022-02-17 NOTE — Anesthesia Preprocedure Evaluation (Signed)
Anesthesia Evaluation    Reviewed: Allergy & Precautions, Patient's Chart, lab work & pertinent test results, reviewed documented beta blocker date and time   Airway        Dental   Pulmonary neg pulmonary ROS          Cardiovascular hypertension, Pt. on home beta blockers and Pt. on medications + dysrhythmias (eliquis) Atrial Fibrillation  Rhythm:Irregular  TTE 2023  1. Left ventricular ejection fraction, by estimation, is 70 to 75%. The  left ventricle has hyperdynamic function. The left ventricle has no  regional wall motion abnormalities. There is severe asymmetric left  ventricular hypertrophy of the basal-septal  segment. Left ventricular diastolic parameters are indeterminate.   2. Right ventricular systolic function is normal. The right ventricular  size is mildly enlarged.   3. The mitral valve is grossly normal. No evidence of mitral valve  regurgitation. No evidence of mitral stenosis.   4. The aortic valve is grossly normal. Aortic valve regurgitation is not  visualized. No aortic stenosis is present.      Neuro/Psych CVA  negative psych ROS   GI/Hepatic negative GI ROS, Neg liver ROS,,,  Endo/Other  diabetes    Renal/GU Renal InsufficiencyRenal disease  negative genitourinary   Musculoskeletal negative musculoskeletal ROS (+)    Abdominal   Peds  Hematology negative hematology ROS (+)   Anesthesia Other Findings   Reproductive/Obstetrics                             Anesthesia Physical Anesthesia Plan  ASA: 3  Anesthesia Plan: General   Post-op Pain Management:    Induction: Intravenous  PONV Risk Score and Plan: Propofol infusion and Treatment may vary due to age or medical condition  Airway Management Planned: Natural Airway  Additional Equipment:   Intra-op Plan:   Post-operative Plan:   Informed Consent: I have reviewed the patients History and Physical,  chart, labs and discussed the procedure including the risks, benefits and alternatives for the proposed anesthesia with the patient or authorized representative who has indicated his/her understanding and acceptance.     Dental advisory given  Plan Discussed with: CRNA  Anesthesia Plan Comments: (Procedure cancelled. Pt in sinus rhythm.)       Anesthesia Quick Evaluation

## 2022-02-18 ENCOUNTER — Encounter (HOSPITAL_COMMUNITY): Payer: Self-pay | Admitting: Cardiology

## 2022-02-18 ENCOUNTER — Encounter (HOSPITAL_COMMUNITY)
Admission: RE | Disposition: A | Discharge status: Home / Self Care | Payer: Self-pay | Source: Home / Self Care | Attending: Cardiology

## 2022-02-18 ENCOUNTER — Other Ambulatory Visit: Payer: Self-pay

## 2022-02-18 ENCOUNTER — Ambulatory Visit (HOSPITAL_COMMUNITY)
Admission: RE | Admit: 2022-02-18 | Discharge: 2022-02-18 | Disposition: A | Discharge status: Home / Self Care | Payer: Medicare PPO | Attending: Cardiology | Admitting: Cardiology

## 2022-02-18 DIAGNOSIS — Z538 Procedure and treatment not carried out for other reasons: Secondary | ICD-10-CM | POA: Insufficient documentation

## 2022-02-18 DIAGNOSIS — I4892 Unspecified atrial flutter: Secondary | ICD-10-CM | POA: Diagnosis not present

## 2022-02-18 SURGERY — CANCELLED PROCEDURE
Anesthesia: General

## 2022-02-18 MED ORDER — SODIUM CHLORIDE 0.9 % IV SOLN: INTRAVENOUS | Status: DC

## 2022-02-18 NOTE — H&P (Signed)
Patient presented for cardioversion for atrial flutter.  Found to be in sinus rhythm, confirmed on 12 lead EKG.  Cardioversion cancelled.  Donato Heinz, MD

## 2022-02-24 ENCOUNTER — Encounter: Payer: Self-pay | Admitting: Nurse Practitioner

## 2022-02-24 ENCOUNTER — Ambulatory Visit: Payer: Medicare PPO | Attending: Physician Assistant | Admitting: Nurse Practitioner

## 2022-02-24 ENCOUNTER — Ambulatory Visit: Payer: Medicare PPO | Admitting: Physician Assistant

## 2022-02-24 VITALS — BP 154/74 | HR 71 | Ht 69.5 in | Wt 212.0 lb

## 2022-02-24 DIAGNOSIS — E1165 Type 2 diabetes mellitus with hyperglycemia: Secondary | ICD-10-CM

## 2022-02-24 DIAGNOSIS — I480 Paroxysmal atrial fibrillation: Secondary | ICD-10-CM | POA: Diagnosis not present

## 2022-02-24 DIAGNOSIS — I10 Essential (primary) hypertension: Secondary | ICD-10-CM | POA: Diagnosis not present

## 2022-02-24 DIAGNOSIS — Z794 Long term (current) use of insulin: Secondary | ICD-10-CM

## 2022-02-24 DIAGNOSIS — D6869 Other thrombophilia: Secondary | ICD-10-CM | POA: Diagnosis not present

## 2022-02-24 MED ORDER — METOPROLOL SUCCINATE ER 25 MG PO TB24: 25.0000 mg | ORAL_TABLET | Freq: Every day | ORAL | 3 refills | Status: AC

## 2022-02-24 NOTE — Progress Notes (Signed)
Office Visit    Patient Name: Robert Cortez Date of Encounter: 02/24/2022  Primary Care Provider:  [name], MD Primary Cardiologist:  Freada Bergeron, MD Primary Electrophysiologist: None  Chief Complaint    Robert Cortez is a 82 y.o. male with PMH of HTN, HL D, DM type II, colon cancer, TIA (2007) who presents today for atrial fibrillation/flutter.  Past Medical History    Past Medical History:  Diagnosis Date   Colon cancer ([place])    Diabetes mellitus without complication ([place])    HLD (hyperlipidemia)    Hypertension    Mini stroke 2007   Past Surgical History:  Procedure Laterality Date   COLON SURGERY     Precancerous lesion    Allergies  Allergies  Allergen Reactions   Bee Venom Anaphylaxis   Shellfish Allergy Anaphylaxis and Hives    History of Present Illness    Robert Cortez  is a 82 year old male with the above mention past medical history who presents today for follow-up of atrial fibrillation.  Robert Cortez was admitted 01/12/2022 with syncope and found to have slurred speech and right-sided weakness.  MRI was completed revealing numerous old strokes both cortical and subcortical.  CT revealed no acute findings.  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  Patient was seen on 02/03/2022 and atrial fibs clinic.  He was in slow atrial flutter with heart rate in the 40s and he was set up for DCCV after uninterrupted 3 weeks of Eliquis.  Patient presented today hospital 02/18/2022 for DCCV procedure but found to be in sinus rhythm.  His procedure was canceled.  Robert Cortez presents today for hospital follow-up and management of new onset atrial fibrillation.  Since last being seen in the office patient reports he has been doing well with no complaints of dizziness or shortness of breath.  His blood  pressures however are elevated at 146/80 and was 154/74 on recheck.  He reports full compliance with his current medication regimen and denies any adverse reactions.  He is compliant with his Eliquis and has not missed any doses since starting.  Throughout visit we also discussed pathophysiology of atrial fibrillation and patient had no additional questions.  Patient denies chest pain, palpitations, dyspnea, PND, orthopnea, nausea, vomiting, dizziness, syncope, edema, weight gain, or early satiety.   Home Medications    Current Outpatient Medications  Medication Sig Dispense Refill   amLODipine (NORVASC) 10 MG tablet Take 10 mg by mouth daily.     apixaban (ELIQUIS) 5 MG TABS tablet Take 1 tablet (5 mg total) by mouth 2 (two) times daily. 60 tablet 3   cyanocobalamin (VITAMIN B12) 1000 MCG tablet Take 1,000 mcg by mouth every morning.     EPINEPHrine (EPIPEN 2-PAK) 0.3 mg/0.3 mL IJ SOAJ injection Inject 0.3 mg into the muscle as needed for anaphylaxis.     INVOKAMET [phone] MG TABS Take 1 tablet by mouth 2 (two) times daily.     losartan (COZAAR) 100 MG tablet Take 100 mg by mouth daily.     simvastatin (ZOCOR) 20 MG tablet Take 20 mg by mouth at bedtime.     SOLIQUA 100-33 UNT-MCG/ML SOPN Inject 30 Units into the skin in the morning.     metoprolol succinate (TOPROL-XL) 25 MG 24 hr tablet Take 1 tablet (  25 mg total) by mouth daily. 30 tablet 3   No current facility-administered medications for this visit.     Review of Systems  Please see the history of present illness.     All other systems reviewed and are otherwise negative except as noted above.  Physical Exam    Wt Readings from Last 3 Encounters:  02/24/22 212 lb (96.2 kg)  02/03/22 209 lb 3.2 oz (94.9 kg)  01/14/22 212 lb 15.4 oz (96.6 kg)   VS: Vitals:   02/24/22 1535 02/24/22 1613  BP: (!) 146/80 (!) 154/74  Pulse: 71   SpO2: 98%   ,Body mass index is 30.86 kg/m.  Constitutional:      Appearance: Healthy  appearance. Not in distress.  Neck:     Vascular: JVD normal.  Pulmonary:     Effort: Pulmonary effort is normal.     Breath sounds: No wheezing. No rales. Diminished in the bases Cardiovascular:     Normal rate. Regular rhythm. Normal S1. Normal S2.      Murmurs: There is no murmur.  Edema:    Peripheral edema absent.  Abdominal:     Palpations: Abdomen is soft non tender. There is no hepatomegaly.  Skin:    General: Skin is warm and dry.  Neurological:     General: No focal deficit present.     Mental Status: Alert and oriented to person, place and time.     Cranial Nerves: Cranial nerves are intact.  EKG/LABS/Other Studies Reviewed    ECG personally reviewed by me today -none completed today  Risk Assessment/Calculations:    CHA2DS2-VASc Score = 6   This indicates a 9.7% annual risk of stroke. The patient's score is based upon: CHF History: 0 HTN History: 1 Diabetes History: 1 Stroke History: 2 Vascular Disease History: 0 Age Score: 2 Gender Score: 0           Lab Results  Component Value Date   WBC 9.1 02/16/2022   HGB 14.4 02/16/2022   HCT 46.4 02/16/2022   MCV 94.9 02/16/2022   PLT 217 02/16/2022   Lab Results  Component Value Date   CREATININE 1.34 (H) 02/16/2022   BUN 10 02/16/2022   NA 139 02/16/2022   K 4.1 02/16/2022   CL 103 02/16/2022   CO2 25 02/16/2022   Lab Results  Component Value Date   ALT 18 01/14/2022   AST 25 01/14/2022   ALKPHOS 93 01/14/2022   BILITOT 0.9 01/14/2022   Lab Results  Component Value Date   CHOL 138 01/13/2022   HDL 61 01/13/2022   LDLCALC 65 01/13/2022   TRIG 60 01/13/2022   CHOLHDL 2.3 01/13/2022    Lab Results  Component Value Date   HGBA1C 8.2 (H) 01/13/2022    Assessment & Plan    1.  Paroxysmal atrial fibrillation: -Patient presents today in sinus rhythm with rate of 71 bpm verified by auscultation and palpation. -Continue Eliquis 5 mg twice daily and Toprol 25 mg daily for rate  control. -Patient was advised to contact office if he notices increased shortness of breath or dizziness.  2.  Essential hypertension: HYPERTENSION CONTROL Vitals:   02/24/22 1535 02/24/22 1613  BP: (!) 146/80 (!) 154/74    The patient's blood pressure is elevated above target today.  In order to address the patient's elevated BP: Blood pressure will be monitored at home to determine if medication changes need to be made.; A current anti-hypertensive medication was adjusted today.  Continue Norvasc 10 mg daily, losartan 100 mg daily and we will increase Toprol-XL to 25 mg with plan to titrate further if BP remains elevated.  3.  DM type II: -Continue current treatment plan per PCP  4.  Secondary hypercoagulable state: -Patient currently on Eliquis 5 mg twice daily with no complaints of bleeding.  Disposition: Follow-up with Freada Bergeron, MD or APP in 3 months    Medication Adjustments/Labs and Tests Ordered: Current medicines are reviewed at length with the patient today.  Concerns regarding medicines are outlined above.   Signed, Mable Fill, Marissa Nestle, NP 02/24/2022, 4:18 PM [facility]  Note:  This document was prepared using Dragon voice recognition software and may include unintentional dictation errors.

## 2022-02-24 NOTE — Patient Instructions (Signed)
Medication Instructions:  INCREASE Toprol XL to '25mg'$  take 1 tablet once a day  *If you need a refill on your cardiac medications before your next appointment, please call your pharmacy*   Lab Work: None ordered   Testing/Procedures: None ordered   Follow-Up: At [facility], you and your health needs are our priority.  As part of our continuing mission to provide you with exceptional heart care, we have created designated Provider Care Teams.  These Care Teams include your primary Cardiologist (physician) and Advanced Practice Providers (APPs -  Physician Assistants and Nurse Practitioners) who all work together to provide you with the care you need, when you need it.  We recommend signing up for the patient portal called "MyChart".  Sign up information is provided on this After Visit Summary.  MyChart is used to connect with patients for Virtual Visits (Telemedicine).  Patients are able to view lab/test results, encounter notes, upcoming appointments, etc.  Non-urgent messages can be sent to your provider as well.   To learn more about what you can do with MyChart, go to [email or web address].    Your next appointment:   3 month(s)  Provider:   Freada Bergeron, MD     Other Instructions

## 2022-03-05 ENCOUNTER — Encounter (HOSPITAL_COMMUNITY): Payer: Self-pay | Admitting: *Deleted

## 2022-03-26 ENCOUNTER — Emergency Department (HOSPITAL_BASED_OUTPATIENT_CLINIC_OR_DEPARTMENT_OTHER)
Admission: EM | Admit: 2022-03-26 | Discharge: 2022-03-26 | Disposition: A | Discharge status: Home / Self Care | Payer: Medicare PPO | Attending: Emergency Medicine | Admitting: Emergency Medicine

## 2022-03-26 ENCOUNTER — Other Ambulatory Visit (HOSPITAL_BASED_OUTPATIENT_CLINIC_OR_DEPARTMENT_OTHER): Payer: Self-pay

## 2022-03-26 ENCOUNTER — Emergency Department (HOSPITAL_BASED_OUTPATIENT_CLINIC_OR_DEPARTMENT_OTHER): Payer: Medicare PPO

## 2022-03-26 ENCOUNTER — Other Ambulatory Visit: Payer: Self-pay

## 2022-03-26 ENCOUNTER — Encounter (HOSPITAL_BASED_OUTPATIENT_CLINIC_OR_DEPARTMENT_OTHER): Payer: Self-pay | Admitting: Emergency Medicine

## 2022-03-26 DIAGNOSIS — I130 Hypertensive heart and chronic kidney disease with heart failure and stage 1 through stage 4 chronic kidney disease, or unspecified chronic kidney disease: Secondary | ICD-10-CM | POA: Insufficient documentation

## 2022-03-26 DIAGNOSIS — E1122 Type 2 diabetes mellitus with diabetic chronic kidney disease: Secondary | ICD-10-CM | POA: Diagnosis not present

## 2022-03-26 DIAGNOSIS — N1831 Chronic kidney disease, stage 3a: Secondary | ICD-10-CM | POA: Diagnosis not present

## 2022-03-26 DIAGNOSIS — R059 Cough, unspecified: Secondary | ICD-10-CM | POA: Diagnosis not present

## 2022-03-26 DIAGNOSIS — I5031 Acute diastolic (congestive) heart failure: Secondary | ICD-10-CM

## 2022-03-26 DIAGNOSIS — Z20822 Contact with and (suspected) exposure to covid-19: Secondary | ICD-10-CM | POA: Diagnosis not present

## 2022-03-26 DIAGNOSIS — Z85038 Personal history of other malignant neoplasm of large intestine: Secondary | ICD-10-CM | POA: Insufficient documentation

## 2022-03-26 DIAGNOSIS — Z7901 Long term (current) use of anticoagulants: Secondary | ICD-10-CM | POA: Diagnosis not present

## 2022-03-26 DIAGNOSIS — Z79899 Other long term (current) drug therapy: Secondary | ICD-10-CM | POA: Insufficient documentation

## 2022-03-26 DIAGNOSIS — I110 Hypertensive heart disease with heart failure: Secondary | ICD-10-CM | POA: Diagnosis not present

## 2022-03-26 DIAGNOSIS — I509 Heart failure, unspecified: Secondary | ICD-10-CM | POA: Diagnosis not present

## 2022-03-26 LAB — CBC WITH DIFFERENTIAL/PLATELET
Abs Immature Granulocytes: 0.02 10*3/uL (ref 0.00–0.07)
Basophils Absolute: 0 10*3/uL (ref 0.0–0.1)
Basophils Relative: 0 %
Eosinophils Absolute: 0.1 10*3/uL (ref 0.0–0.5)
Eosinophils Relative: 1 %
HCT: 43.2 % (ref 39.0–52.0)
Hemoglobin: 14.2 g/dL (ref 13.0–17.0)
Immature Granulocytes: 0 %
Lymphocytes Relative: 23 %
Lymphs Abs: 1.9 10*3/uL (ref 0.7–4.0)
MCH: 29.2 pg (ref 26.0–34.0)
MCHC: 32.9 g/dL (ref 30.0–36.0)
MCV: 88.7 fL (ref 80.0–100.0)
Monocytes Absolute: 0.7 10*3/uL (ref 0.1–1.0)
Monocytes Relative: 9 %
Neutro Abs: 5.6 10*3/uL (ref 1.7–7.7)
Neutrophils Relative %: 67 %
Platelets: 237 10*3/uL (ref 150–400)
RBC: 4.87 MIL/uL (ref 4.22–5.81)
RDW: 14.5 % (ref 11.5–15.5)
WBC: 8.3 10*3/uL (ref 4.0–10.5)
nRBC: 0 % (ref 0.0–0.2)

## 2022-03-26 LAB — COMPREHENSIVE METABOLIC PANEL
ALT: 15 U/L (ref 0–44)
AST: 15 U/L (ref 15–41)
Albumin: 4.4 g/dL (ref 3.5–5.0)
Alkaline Phosphatase: 88 U/L (ref 38–126)
Anion gap: 12 (ref 5–15)
BUN: 12 mg/dL (ref 8–23)
CO2: 23 mmol/L (ref 22–32)
Calcium: 9.7 mg/dL (ref 8.9–10.3)
Chloride: 104 mmol/L (ref 98–111)
Creatinine, Ser: 1.22 mg/dL (ref 0.61–1.24)
GFR, Estimated: >60 mL/min (ref >60)
Glucose, Bld: 85 mg/dL (ref 70–99)
Potassium: 3.3 mmol/L — ABNORMAL LOW (ref 3.5–5.1)
Sodium: 139 mmol/L (ref 135–145)
Total Bilirubin: 0.9 mg/dL (ref 0.3–1.2)
Total Protein: 8.3 g/dL — ABNORMAL HIGH (ref 6.5–8.1)

## 2022-03-26 LAB — RESP PANEL BY RT-PCR (RSV, FLU A&B, COVID)  RVPGX2
Influenza A by PCR: NEGATIVE
Influenza B by PCR: NEGATIVE
Resp Syncytial Virus by PCR: NEGATIVE
SARS Coronavirus 2 by RT PCR: NEGATIVE

## 2022-03-26 LAB — BRAIN NATRIURETIC PEPTIDE: B Natriuretic Peptide: 206.2 pg/mL — ABNORMAL HIGH (ref 0.0–100.0)

## 2022-03-26 MED ORDER — POTASSIUM CHLORIDE CRYS ER 20 MEQ PO TBCR
40.0000 meq | EXTENDED_RELEASE_TABLET | Freq: Once | ORAL | Status: AC
  Administered 2022-03-26: 40 meq via ORAL
  Filled 2022-03-26: qty 2

## 2022-03-26 MED ORDER — FUROSEMIDE 10 MG/ML IJ SOLN
40.0000 mg | Freq: Once | INTRAMUSCULAR | Status: DC
  Filled 2022-03-26: qty 4

## 2022-03-26 NOTE — Discharge Instructions (Signed)
We evaluated you for your cough.  Your workup shows signs of early heart failure.  We discussed treatment in the emergency department with diuretic medications but you preferred to follow-up with cardiology.  Please return if you develop any shortness of breath, worsening leg swelling, chest pain, nausea or vomiting, fatigue, lightheadedness or dizziness, or any other concerning symptoms.  We have placed a referral for cardiology and they should call you within the next few days to help schedule an appointment.

## 2022-03-26 NOTE — ED Triage Notes (Signed)
Pt arrives to ED with c/o dry cough at night over the past three days.

## 2022-03-26 NOTE — ED Provider Notes (Signed)
[facility] Provider Note  CSN: [id number] Arrival date & time: 03/26/22 [id number]  Chief Complaint(s) Cough  HPI Robert Cortez is a 82 y.o. male with history of diabetes, hyperlipidemia, hypertension, atrial flutter on Eliquis presenting with cough.  Patient reports that he has had a cough for the past 3 days.  Reports the cough is worse at night when lying on his back.  Denies shortness of breath.  Denies leg swelling.  Denies any nausea, vomiting, chest pain.  Denies fevers or chills.  Reports feels vaguely similar to when he was diagnosed with pneumonia in the fall.  Denies productive cough.   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   Leg wound, left 01/12/2022   Acute CVA (cerebrovascular accident) ([place]) 01/12/2022   Cardiovascular symptoms 08/14/2020   Elevated PSA 08/14/2020   History of malignant neoplasm of colon 08/14/2020   Type 2 diabetes mellitus with hyperglycemia ([place]) 08/14/2020   Hypocalcemia 08/14/2020   Essential hypertension, benign 09/25/2013   Diabetes ([place]) 08/30/2013   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 Authorizing Provider  amLODipine (NORVASC) 10 MG tablet Take 10 mg by mouth daily. 03/28/13   [provider]  apixaban (ELIQUIS) 5 MG TABS tablet Take 1 tablet (5 mg total) by mouth 2 (two) times daily. 02/03/22 03/05/22  Sherran Needs, NP  cyanocobalamin (VITAMIN B12) 1000 MCG tablet Take 1,000 mcg by mouth every morning.    [provider]  EPINEPHrine (EPIPEN 2-PAK) 0.3 mg/0.3 mL IJ SOAJ injection Inject 0.3 mg into the muscle as needed for anaphylaxis.    [provider]  INVOKAMET [phone] MG TABS Take 1 tablet by mouth 2 (two) times daily.    [provider]  losartan (COZAAR) 100 MG tablet Take 100 mg by mouth daily. 08/24/13   [provider]  metoprolol succinate (TOPROL-XL) 25 MG 24 hr tablet Take 1 tablet (25 mg total) by mouth daily. 02/24/22   Marylu Lund., NP  simvastatin (ZOCOR) 20 MG tablet Take 20 mg by mouth at bedtime. 08/21/13   [provider]  SOLIQUA 100-33 UNT-MCG/ML SOPN Inject 30 Units into the skin in the morning. 08/18/21   [provider]  Past Surgical History Past Surgical History:  Procedure Laterality Date   COLON SURGERY     Precancerous lesion   Family History Family History  Problem Relation Age of Onset   Diabetes Mother    Hypertension Mother     Social History Social History   Tobacco Use   Smoking status: Never   Smokeless tobacco: Never  Vaping Use   Vaping Use: Never used  Substance Use Topics   Alcohol use: Yes   Drug use: Never   Allergies Bee venom and Shellfish allergy  Review of Systems Review of Systems  All other systems reviewed and are negative.   Physical Exam Vital Signs  I have reviewed the triage vital signs BP (!) 161/71 (BP Location: Left Arm)   Pulse 61   Temp 98.4 F (36.9 C)  (Oral)   Resp (!) 26   Ht 5' 9.5" (1.765 m)   Wt 94.3 kg   SpO2 100%   BMI 30.28 kg/m  Physical Exam Vitals and nursing note reviewed.  Constitutional:      General: He is not in acute distress.    Appearance: Normal appearance.  HENT:     Mouth/Throat:     Mouth: Mucous membranes are moist.  Eyes:     Conjunctiva/sclera: Conjunctivae normal.  Cardiovascular:     Rate and Rhythm: Normal rate and regular rhythm.  Pulmonary:     Effort: Pulmonary effort is normal. No respiratory distress.     Breath sounds: Normal breath sounds.  Abdominal:     General: Abdomen is flat.     Palpations: Abdomen is soft.     Tenderness: There is no abdominal tenderness.  Musculoskeletal:     Right lower leg: Edema present.     Left lower leg: Edema present.     Comments: 2+ bilateral lower extremity edema  Skin:    General: Skin is warm and dry.     Capillary Refill: Capillary refill takes less than 2 seconds.  Neurological:     Mental Status: He is alert and oriented to person, place, and time. Mental status is at baseline.  Psychiatric:        Mood and Affect: Mood normal.        Behavior: Behavior normal.     ED Results and Treatments Labs (all labs ordered are listed, but only abnormal results are displayed) Labs Reviewed  COMPREHENSIVE METABOLIC PANEL - Abnormal; Notable for the following components:      Result Value   Potassium 3.3 (*)    Total Protein 8.3 (*)    All other components within normal limits  BRAIN NATRIURETIC PEPTIDE - Abnormal; Notable for the following components:   B Natriuretic Peptide 206.2 (*)    All other components within normal limits  RESP PANEL BY RT-PCR (RSV, FLU A&B, COVID)  RVPGX2  CBC WITH DIFFERENTIAL/PLATELET  Radiology DG Chest Port 1 View  Result Date: 03/26/2022 CLINICAL DATA:  Cough. EXAM: PORTABLE CHEST 1 VIEW  COMPARISON:  January 21, 2022. FINDINGS: The heart size and mediastinal contours are within normal limits. Both lungs are clear. The visualized skeletal structures are unremarkable. IMPRESSION: No active disease. Electronically Signed   By: Marijo Conception M.D.   On: 03/26/2022 10:40    Pertinent labs & imaging results that were available during my care of the patient were reviewed by me and considered in my medical decision making (see MDM for details).  Medications Ordered in ED Medications  furosemide (LASIX) injection 40 mg (40 mg Intravenous Not Given 03/26/22 1235)  potassium chloride SA (KLOR-CON M) CR tablet 40 mEq (40 mEq Oral Given 03/26/22 1233)                                                                                                                                     Procedures Procedures  (including critical care time)  Medical Decision Making / ED Course   MDM:  82 year old presenting to the emergency department with cough.  Patient overall well-appearing, not hypoxic, physical exam with clear lungs.  Does have bilateral lower extremity edema which patient reports he is not aware of.  Differential includes CHF, viral syndrome, bronchitis, pneumonia.  Not on ACE inhibitor.  Does have bilateral lower extremity edema, will check BNP.  Will reassess.  Clinical Course as of 03/26/22 1257  Thu Mar 26, 2022  1255 Chest x-ray without evidence of pneumonia.  Labs with mild hypokalemia, elevated BNP which would explain his nocturnal cough and lying flat as well as his leg swelling.  Reviewed recent echocardiogram which shows some diastolic dysfunction which would likely be the cause of his symptoms.  Offered to trial treatment in the emergency department such as antihypertensives and diuretics, patient prefers to follow-up with cardiology to initiate treatment.  Patient is slightly hypertensive but no hypoxia, denies any shortness of breath so I think this is reasonable.  He  understands that he needs to return to the emergency department immediately if he does develop any shortness of breath or worsening symptoms. Will discharge patient to home. All questions answered. Patient comfortable with plan of discharge. Return precautions discussed with patient and specified on the after visit summary. [WS]    Clinical Course User Index [WS] Cristie Hem, MD     Additional history obtained:  -External records from outside source obtained and reviewed including: Chart review including previous notes, labs, imaging, consultation notes including recent echocardiogram showing evidence of diastolic dysfunction   Lab Tests: -I ordered, reviewed, and interpreted labs.   The pertinent results include:   Labs Reviewed  COMPREHENSIVE METABOLIC PANEL - Abnormal; Notable for the following components:      Result Value   Potassium 3.3 (*)    Total Protein 8.3 (*)    All other components within normal  limits  BRAIN NATRIURETIC PEPTIDE - Abnormal; Notable for the following components:   B Natriuretic Peptide 206.2 (*)    All other components within normal limits  RESP PANEL BY RT-PCR (RSV, FLU A&B, COVID)  RVPGX2  CBC WITH DIFFERENTIAL/PLATELET    Notable for elevated BNP  EKG   EKG Interpretation  Date/Time:  Thursday March 26 2022 10:35:06 EST Ventricular Rate:  47 PR Interval:  240 QRS Duration: 97 QT Interval:  450 QTC Calculation: 398 R Axis:   104 Text Interpretation: Sinus bradycardia Atrial premature complex Prolonged PR interval Right axis deviation Confirmed by Garnette Gunner [phone]) on 03/26/2022 10:46:23 AM         Imaging Studies ordered: I ordered imaging studies including CXR On my interpretation imaging demonstrates clear lungs I independently visualized and interpreted imaging. I agree with the radiologist interpretation   Medicines ordered and prescription drug management: Meds ordered this encounter  Medications    furosemide (LASIX) injection 40 mg   potassium chloride SA (KLOR-CON M) CR tablet 40 mEq    -I have reviewed the patients home medicines and have made adjustments as needed   Cardiac Monitoring: The patient was maintained on a cardiac monitor.  I personally viewed and interpreted the cardiac monitored which showed an underlying rhythm of: NSR  Co morbidities that complicate the patient evaluation  Past Medical History:  Diagnosis Date   Colon cancer ([place])    Diabetes mellitus without complication ([place])    HLD (hyperlipidemia)    Hypertension    Mini stroke 2007      Dispostion: Disposition decision including need for hospitalization was considered, and patient discharged from emergency department.    Final Clinical Impression(s) / ED Diagnoses Final diagnoses:  Acute diastolic congestive heart failure ([place])     This chart was dictated using voice recognition software.  Despite best efforts to proofread,  errors can occur which can change the documentation meaning.    Cristie Hem, MD 03/26/22 1257

## 2022-03-26 NOTE — ED Notes (Signed)
Discharge instructions given and follow-up care to cardiologist was discussed. Pt had no further questions upon discharge.

## 2022-05-04 DIAGNOSIS — E11319 Type 2 diabetes mellitus with unspecified diabetic retinopathy without macular edema: Secondary | ICD-10-CM | POA: Diagnosis not present

## 2022-05-04 DIAGNOSIS — I129 Hypertensive chronic kidney disease with stage 1 through stage 4 chronic kidney disease, or unspecified chronic kidney disease: Secondary | ICD-10-CM | POA: Diagnosis not present

## 2022-05-04 DIAGNOSIS — Z794 Long term (current) use of insulin: Secondary | ICD-10-CM | POA: Diagnosis not present

## 2022-05-04 DIAGNOSIS — I4891 Unspecified atrial fibrillation: Secondary | ICD-10-CM | POA: Diagnosis not present

## 2022-05-04 DIAGNOSIS — N1831 Chronic kidney disease, stage 3a: Secondary | ICD-10-CM | POA: Diagnosis not present

## 2022-05-04 DIAGNOSIS — E1165 Type 2 diabetes mellitus with hyperglycemia: Secondary | ICD-10-CM | POA: Diagnosis not present

## 2022-05-04 DIAGNOSIS — E1122 Type 2 diabetes mellitus with diabetic chronic kidney disease: Secondary | ICD-10-CM | POA: Diagnosis not present

## 2022-05-14 DIAGNOSIS — E118 Type 2 diabetes mellitus with unspecified complications: Secondary | ICD-10-CM | POA: Diagnosis not present

## 2022-05-27 ENCOUNTER — Ambulatory Visit: Payer: Medicare PPO | Admitting: Cardiology

## 2022-06-03 DIAGNOSIS — E1122 Type 2 diabetes mellitus with diabetic chronic kidney disease: Secondary | ICD-10-CM | POA: Diagnosis not present

## 2022-06-03 DIAGNOSIS — Z125 Encounter for screening for malignant neoplasm of prostate: Secondary | ICD-10-CM | POA: Diagnosis not present

## 2022-06-03 DIAGNOSIS — I129 Hypertensive chronic kidney disease with stage 1 through stage 4 chronic kidney disease, or unspecified chronic kidney disease: Secondary | ICD-10-CM | POA: Diagnosis not present

## 2022-06-03 DIAGNOSIS — Z79899 Other long term (current) drug therapy: Secondary | ICD-10-CM | POA: Diagnosis not present

## 2022-06-03 DIAGNOSIS — R972 Elevated prostate specific antigen [PSA]: Secondary | ICD-10-CM | POA: Diagnosis not present

## 2022-06-03 DIAGNOSIS — N189 Chronic kidney disease, unspecified: Secondary | ICD-10-CM | POA: Diagnosis not present

## 2022-06-03 DIAGNOSIS — G252 Other specified forms of tremor: Secondary | ICD-10-CM | POA: Diagnosis not present

## 2022-06-03 DIAGNOSIS — I4891 Unspecified atrial fibrillation: Secondary | ICD-10-CM | POA: Diagnosis not present

## 2022-06-03 DIAGNOSIS — Z0000 Encounter for general adult medical examination without abnormal findings: Secondary | ICD-10-CM | POA: Diagnosis not present

## 2022-07-08 ENCOUNTER — Encounter: Payer: Self-pay | Admitting: Urology

## 2022-07-08 ENCOUNTER — Ambulatory Visit: Payer: Medicare PPO | Admitting: Urology

## 2022-07-08 VITALS — BP 156/68 | HR 61 | Ht 69.5 in | Wt 210.0 lb

## 2022-07-08 DIAGNOSIS — R972 Elevated prostate specific antigen [PSA]: Secondary | ICD-10-CM | POA: Diagnosis not present

## 2022-07-08 LAB — URINALYSIS, COMPLETE
Bilirubin, UA: NEGATIVE
Ketones, UA: NEGATIVE
Leukocytes,UA: NEGATIVE
Nitrite, UA: NEGATIVE
Specific Gravity, UA: 1.015 (ref 1.005–1.030)
Urobilinogen, Ur: 0.2 mg/dL (ref 0.2–1.0)
pH, UA: 5 (ref 5.0–7.5)

## 2022-07-08 LAB — MICROSCOPIC EXAMINATION

## 2022-07-08 NOTE — Progress Notes (Signed)
I, Robert Cortez, acting as a Neurosurgeon for Robert Altes, MD., have documented all relevant documentation on the behalf of Robert Altes, MD, as directed by  Robert Altes, MD while in the presence of Robert Altes, MD.  07/08/2022 11:31 AM   Robert Cortez Jun 19, 1940 [id number]  Referring provider: Mick Sell, MD [address] [place],  [place] [address]  Chief Complaint  Patient presents with   Elevated PSA    HPI: Robert Cortez is a 82 y.o. male referred for evaluation of an elevated PSA.  PSA drawn 06/03/22, elevated at 10.4 on chart review. A prior PSA in 2012 was 3.16 and 1.5 in 2005. He states he requested a urology referral as he was being followed by [facility], but had not been seen in 2 years. He does not remember exactly why he was being seen at [facility]. Denies previous prostate biopsy. 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 The implant has not worked in 2 years.  He is not sexually active He has no bothersome LUTS, but does noted occasional bilateral groin tightness.   PMH: Past Medical History:  Diagnosis Date   Colon cancer ([facility])    Diabetes mellitus without complication ([facility])    HLD (hyperlipidemia)    Hypertension    Mini stroke 2007    Surgical History: Past Surgical History:  Procedure Laterality Date   COLON SURGERY     Precancerous lesion    Home Medications:  Allergies as of 07/08/2022       Reactions   Bee Venom Anaphylaxis   Shellfish Allergy Anaphylaxis, Hives        Medication List        Accurate as of July 08, 2022 11:31 AM. If you have any questions, ask your nurse or doctor.          STOP taking these medications    apixaban 5 MG Tabs tablet Commonly known as: ELIQUIS Stopped by: Robert Altes, MD       TAKE these medications    amLODipine 10 MG tablet Commonly known as: NORVASC Take 10 mg by mouth daily.   aspirin EC 325 MG tablet 1 tablet Orally  Once a day   cyanocobalamin 1000 MCG tablet Commonly known as: VITAMIN B12 Take 1,000 mcg by mouth every morning.   EpiPen 2-Pak 0.3 mg/0.3 mL Soaj injection Generic drug: EPINEPHrine Inject 0.3 mg into the muscle as needed for anaphylaxis.   Invokamet [phone] MG Tabs Generic drug: Canagliflozin-metFORMIN HCl Take 1 tablet by mouth 2 (two) times daily.   losartan 100 MG tablet Commonly known as: COZAAR Take 100 mg by mouth daily.   metoprolol succinate 25 MG 24 hr tablet Commonly known as: TOPROL-XL Take 1 tablet (25 mg total) by mouth daily.   simvastatin 20 MG tablet Commonly known as: ZOCOR Take 20 mg by mouth at bedtime.   Soliqua 100-33 UNT-MCG/ML Sopn Generic drug: Insulin Glargine-Lixisenatide Inject 30 Units into the skin in the morning.        Allergies:  Allergies  Allergen Reactions   Bee Venom Anaphylaxis   Shellfish Allergy Anaphylaxis and Hives    Family History: Family History  Problem Relation Age of Onset   Diabetes Mother    Hypertension Mother     Social History:  reports that he has never smoked. He has never used smokeless tobacco. He reports current alcohol use. He reports that he does not  use drugs.   Physical Exam: BP (!) 156/68   Pulse 61   Ht 5' 9.5" (1.765 m)   Wt 210 lb (95.3 kg)   BMI 30.57 kg/m   Constitutional:  Alert and oriented, No acute distress. HEENT: [place] AT Respiratory: Normal respiratory effort, no increased work of breathing. GI: Abdomen is soft, nontender, nondistended, no abdominal masses GU: Prostate 60+ g, smooth without nodules. Psychiatric: Normal mood and affect.  Laboratory Data:  Urinalysis Dipstick/microscopy negative   Assessment & Plan:    1. Elevated PSA Released signed to obtain [facility] records for review. Schedule lab visit for PSA in 3 weeks. Rule out transient elevation.  I have reviewed the above documentation for accuracy and completeness, and I agree with the above.    Robert Altes, MD  [facility] [address] [place], [place] [address] [phone]

## 2022-07-27 ENCOUNTER — Other Ambulatory Visit: Payer: Self-pay | Admitting: *Deleted

## 2022-07-27 DIAGNOSIS — R972 Elevated prostate specific antigen [PSA]: Secondary | ICD-10-CM

## 2022-07-29 ENCOUNTER — Other Ambulatory Visit: Payer: Medicare PPO

## 2022-07-29 DIAGNOSIS — R972 Elevated prostate specific antigen [PSA]: Secondary | ICD-10-CM

## 2022-07-30 ENCOUNTER — Other Ambulatory Visit: Payer: Self-pay | Admitting: Urology

## 2022-07-30 ENCOUNTER — Encounter: Payer: Self-pay | Admitting: Urology

## 2022-07-30 DIAGNOSIS — R972 Elevated prostate specific antigen [PSA]: Secondary | ICD-10-CM

## 2022-07-30 LAB — PSA: Prostate Specific Ag, Serum: 8.7 ng/mL — ABNORMAL HIGH (ref 0.0–4.0)

## 2022-08-03 NOTE — Addendum Note (Signed)
Addended by: Levada Schilling on: 08/03/2022 09:42 AM   Modules accepted: Orders

## 2022-08-05 ENCOUNTER — Encounter: Payer: Self-pay | Admitting: Urology

## 2022-08-10 ENCOUNTER — Ambulatory Visit: Payer: Medicare PPO

## 2022-08-13 ENCOUNTER — Ambulatory Visit
Admission: RE | Admit: 2022-08-13 | Discharge: 2022-08-13 | Disposition: A | Discharge status: Home / Self Care | Payer: Medicare PPO | Source: Ambulatory Visit | Attending: Urology | Admitting: Urology

## 2022-08-13 DIAGNOSIS — R972 Elevated prostate specific antigen [PSA]: Secondary | ICD-10-CM | POA: Insufficient documentation

## 2022-08-13 MED ORDER — GADOBUTROL 1 MMOL/ML IV SOLN
9.0000 mL | Freq: Once | INTRAVENOUS | Status: AC | PRN
  Administered 2022-08-13: 9 mL via INTRAVENOUS

## 2022-08-17 ENCOUNTER — Other Ambulatory Visit: Payer: Self-pay | Admitting: *Deleted

## 2022-08-17 ENCOUNTER — Encounter: Payer: Self-pay | Admitting: *Deleted

## 2022-08-17 DIAGNOSIS — R972 Elevated prostate specific antigen [PSA]: Secondary | ICD-10-CM

## 2022-12-23 ENCOUNTER — Telehealth: Payer: Self-pay | Admitting: *Deleted

## 2022-12-23 NOTE — Telephone Encounter (Signed)
Left message for patient to return the call.

## 2022-12-23 NOTE — Telephone Encounter (Signed)
-----   Message from Verna Czech [facility] sent at 12/18/2022  7:34 AM EST ----- Regarding: RE: fUSION BX Good morning, looks like his last PSA was in July.  I will get him scheduled with Carollee Herter or Sam with a follow-up PSA before scheduling biopsy.  Thanks  Shanda Bumps, please schedule follow-up visit with Carollee Herter or Sam with a PSA prior.  Thanks ----- Message ----- From: Kipp Brood Sent: 12/15/2022   1:17 PM EST To: Riki Altes, MD Subject: fUSION BX                                      Hey, You ordered a Fusion BX on this patient back in July at [facility]. Since they are now doing them in [place] do you want to cancel this referral and have it done there? It was never done at [facility] because they needed a clearance for him to stop his Eliquis and that was never submitted to his doctor from what I can tell.  Thanks, Marcelino Duster

## 2023-01-04 ENCOUNTER — Telehealth: Payer: Self-pay | Admitting: *Deleted

## 2023-01-04 NOTE — Telephone Encounter (Signed)
Left message for patient  schedule follow-up visit with Carollee Herter or Sam with a PSA prior.  T

## 2023-08-25 ENCOUNTER — Encounter: Payer: Self-pay | Admitting: Urology

## 2023-08-25 ENCOUNTER — Ambulatory Visit: Admitting: Urology

## 2023-08-25 VITALS — BP 165/77 | HR 68 | Ht 69.0 in | Wt 208.0 lb

## 2023-08-25 DIAGNOSIS — R972 Elevated prostate specific antigen [PSA]: Secondary | ICD-10-CM

## 2023-08-25 NOTE — Progress Notes (Signed)
 08/25/2023 8:47 AM   [place] MATSU Quinteros 23-Jun-1940 [id number]  Referring provider: Epifanio Alm SQUIBB, MD [address] [place],  [place] [address]  Chief Complaint  Patient presents with   Elevated PSA    HPI: Robert Cortez is a 83 y.o. male referred for evaluation of an elevated PSA.  Initially seen 07/08/2022 for a PSA of 10.4; follow-up PSA was 8.7 Prostate MRI July 2024 with an 82 cc volume and a small PI-RADS 4 lesion right PZ Prostate biopsy was discussed and he elected to schedule He states he obtained clearance to hold Eliquis  prior to biopsy however for some reason the biopsy was never scheduled Repeat PSA March 2025 was 10.9 No bothersome LUTS Medical comorbidities include hypertension, hyperlipidemia, CVA and atrial fibrillation   PMH: Past Medical History:  Diagnosis Date   Colon cancer (H[facility])    Diabetes mellitus without complication ([facility])    HLD (hyperlipidemia)    Hypertension    Mini stroke 2007    Surgical History: Past Surgical History:  Procedure Laterality Date   COLON SURGERY     Precancerous lesion    Home Medications:  Allergies as of 08/25/2023       Reactions   Bee Venom Anaphylaxis   Shellfish Allergy Anaphylaxis, Hives        Medication List        Accurate as of August 25, 2023  8:47 AM. If you have any questions, ask your nurse or doctor.          amLODipine 10 MG tablet Commonly known as: NORVASC Take 10 mg by mouth daily.   aspirin  EC 325 MG tablet 1 tablet Orally Once a day   cyanocobalamin  1000 MCG tablet Commonly known as: VITAMIN B12 Take 1,000 mcg by mouth every morning.   EpiPen 2-Pak 0.3 mg/0.3 mL Soaj injection Generic drug: EPINEPHrine Inject 0.3 mg into the muscle as needed for anaphylaxis.   Invokamet  [phone] MG Tabs Generic drug: Canagliflozin -metFORMIN  HCl Take 1 tablet by mouth 2 (two) times daily.   losartan 100 MG tablet Commonly known as: COZAAR Take 100 mg by mouth daily.    metoprolol  succinate 25 MG 24 hr tablet Commonly known as: TOPROL -XL Take 1 tablet (25 mg total) by mouth daily.   simvastatin  20 MG tablet Commonly known as: ZOCOR  Take 20 mg by mouth at bedtime.   Soliqua  100-33 UNT-MCG/ML Sopn Generic drug: Insulin  Glargine-Lixisenatide  Inject 30 Units into the skin in the morning.        Allergies:  Allergies  Allergen Reactions   Bee Venom Anaphylaxis   Shellfish Allergy Anaphylaxis and Hives    Family History: Family History  Problem Relation Age of Onset   Diabetes Mother    Hypertension Mother     Social History:  reports that he has never smoked. He has never used smokeless tobacco. He reports current alcohol use. He reports that he does not use drugs.   Physical Exam: BP (!) 165/77   Pulse 68   Ht 5' 9 (1.753 m)   Wt 208 lb (94.3 kg)   BMI 30.72 kg/m   Constitutional:  Alert and oriented, No acute distress. HEENT:  AT Respiratory: Normal respiratory effort, no increased work of breathing. Psychiatric: Normal mood and affect.   Assessment & Plan:    1. Elevated PSA Discussed MR fusion biopsy His life expectancy based on SSA tables is ~5 years.  If he were diagnosed with high-grade prostate cancer we discussed management options  of ADT, EBRT and observation He states at this time he would be inclined to elect observation if he was diagnosed with high-grade prostate cancer since he is asymptomatic and wants to hold off on prostate biopsy at this time Will recheck PSA September 2025    Glendia JAYSON Barba, MD  [facility] [address] [place], [place] [address] [phone]

## 2023-08-26 ENCOUNTER — Encounter: Payer: Self-pay | Admitting: Urology

## 2023-11-08 ENCOUNTER — Encounter: Payer: Self-pay | Admitting: Urology

## 2024-02-17 ENCOUNTER — Other Ambulatory Visit

## 2024-02-17 DIAGNOSIS — R972 Elevated prostate specific antigen [PSA]: Secondary | ICD-10-CM

## 2024-02-18 LAB — PSA: Prostate Specific Ag, Serum: 11.9 ng/mL — ABNORMAL HIGH (ref 0.0–4.0)

## 2024-02-22 ENCOUNTER — Ambulatory Visit: Admitting: Urology

## 2024-02-25 ENCOUNTER — Ambulatory Visit: Admitting: Urology

## 2024-02-28 ENCOUNTER — Ambulatory Visit: Admitting: Urology

## 2024-03-28 ENCOUNTER — Ambulatory Visit: Admitting: Urology
